# Patient Record
Sex: Female | Born: 1935 | Race: White | Hispanic: No | Smoking: Never smoker
Health system: Southern US, Community
[De-identification: ages and names within clinical notes are randomized; demographics above are authoritative.]

## PROBLEM LIST (undated history)

## (undated) DIAGNOSIS — S72001A Fracture of unspecified part of neck of right femur, initial encounter for closed fracture: Secondary | ICD-10-CM

## (undated) DIAGNOSIS — M545 Low back pain, unspecified: Secondary | ICD-10-CM

## (undated) DIAGNOSIS — E785 Hyperlipidemia, unspecified: Secondary | ICD-10-CM

## (undated) DIAGNOSIS — I2510 Atherosclerotic heart disease of native coronary artery without angina pectoris: Secondary | ICD-10-CM

## (undated) DIAGNOSIS — I10 Essential (primary) hypertension: Secondary | ICD-10-CM

## (undated) DIAGNOSIS — E039 Hypothyroidism, unspecified: Secondary | ICD-10-CM

## (undated) DIAGNOSIS — D649 Anemia, unspecified: Secondary | ICD-10-CM

## (undated) DIAGNOSIS — I6529 Occlusion and stenosis of unspecified carotid artery: Secondary | ICD-10-CM

## (undated) DIAGNOSIS — I4891 Unspecified atrial fibrillation: Secondary | ICD-10-CM

## (undated) DIAGNOSIS — I5032 Chronic diastolic (congestive) heart failure: Secondary | ICD-10-CM

## (undated) HISTORY — PX: ABDOMINAL HYSTERECTOMY: SHX81

## (undated) HISTORY — DX: Hypothyroidism, unspecified: E03.9

## (undated) HISTORY — PX: CORONARY ANGIOPLASTY WITH STENT PLACEMENT: SHX49

## (undated) HISTORY — DX: Low back pain: M54.5

## (undated) HISTORY — DX: Occlusion and stenosis of unspecified carotid artery: I65.29

## (undated) HISTORY — DX: Atherosclerotic heart disease of native coronary artery without angina pectoris: I25.10

## (undated) HISTORY — DX: Low back pain, unspecified: M54.50

## (undated) HISTORY — PX: HEMORRHOID SURGERY: SHX153

## (undated) HISTORY — DX: Chronic diastolic (congestive) heart failure: I50.32

## (undated) HISTORY — DX: Hyperlipidemia, unspecified: E78.5

## (undated) HISTORY — DX: Unspecified atrial fibrillation: I48.91

## (undated) HISTORY — DX: Fracture of unspecified part of neck of right femur, initial encounter for closed fracture: S72.001A

## (undated) HISTORY — DX: Anemia, unspecified: D64.9

## (undated) HISTORY — PX: CARDIAC SURGERY: SHX584

## (undated) HISTORY — DX: Essential (primary) hypertension: I10

## (undated) HISTORY — PX: TOTAL HIP ARTHROPLASTY: SHX124

---

## 1999-08-20 ENCOUNTER — Ambulatory Visit (HOSPITAL_COMMUNITY): Admission: RE | Admit: 1999-08-20 | Discharge: 1999-08-20 | Payer: Self-pay | Admitting: *Deleted

## 1999-11-13 ENCOUNTER — Other Ambulatory Visit: Admission: RE | Admit: 1999-11-13 | Discharge: 1999-11-13 | Payer: Self-pay | Admitting: *Deleted

## 1999-11-20 ENCOUNTER — Encounter: Admission: RE | Admit: 1999-11-20 | Discharge: 1999-11-20 | Payer: Self-pay | Admitting: *Deleted

## 1999-11-20 ENCOUNTER — Encounter: Payer: Self-pay | Admitting: *Deleted

## 2000-12-01 ENCOUNTER — Encounter: Admission: RE | Admit: 2000-12-01 | Discharge: 2000-12-01 | Payer: Self-pay | Admitting: Internal Medicine

## 2000-12-01 ENCOUNTER — Encounter: Payer: Self-pay | Admitting: Internal Medicine

## 2002-02-16 ENCOUNTER — Encounter: Admission: RE | Admit: 2002-02-16 | Discharge: 2002-02-16 | Payer: Self-pay | Admitting: Cardiology

## 2002-02-16 ENCOUNTER — Encounter: Payer: Self-pay | Admitting: Cardiology

## 2002-12-09 ENCOUNTER — Inpatient Hospital Stay (HOSPITAL_COMMUNITY): Admission: EM | Admit: 2002-12-09 | Discharge: 2002-12-15 | Payer: Self-pay | Admitting: Emergency Medicine

## 2002-12-09 ENCOUNTER — Encounter: Payer: Self-pay | Admitting: Emergency Medicine

## 2002-12-10 ENCOUNTER — Encounter: Payer: Self-pay | Admitting: Cardiology

## 2003-04-12 ENCOUNTER — Encounter: Admission: RE | Admit: 2003-04-12 | Discharge: 2003-04-12 | Payer: Self-pay | Admitting: Internal Medicine

## 2003-05-27 ENCOUNTER — Inpatient Hospital Stay (HOSPITAL_COMMUNITY): Admission: EM | Admit: 2003-05-27 | Discharge: 2003-05-30 | Payer: Self-pay | Admitting: Emergency Medicine

## 2003-05-30 ENCOUNTER — Inpatient Hospital Stay (HOSPITAL_COMMUNITY)
Admission: RE | Admit: 2003-05-30 | Discharge: 2003-06-06 | Payer: Self-pay | Admitting: Physical Medicine & Rehabilitation

## 2004-02-19 ENCOUNTER — Ambulatory Visit: Payer: Self-pay | Admitting: Internal Medicine

## 2004-05-08 ENCOUNTER — Encounter: Admission: RE | Admit: 2004-05-08 | Discharge: 2004-05-08 | Payer: Self-pay | Admitting: Cardiology

## 2004-06-14 ENCOUNTER — Encounter
Admission: RE | Admit: 2004-06-14 | Discharge: 2004-09-12 | Payer: Self-pay | Admitting: Physical Medicine and Rehabilitation

## 2004-07-18 ENCOUNTER — Ambulatory Visit: Payer: Self-pay | Admitting: Cardiology

## 2004-07-18 ENCOUNTER — Inpatient Hospital Stay (HOSPITAL_COMMUNITY): Admission: EM | Admit: 2004-07-18 | Discharge: 2004-07-19 | Payer: Self-pay | Admitting: Emergency Medicine

## 2004-07-19 ENCOUNTER — Encounter: Payer: Self-pay | Admitting: Cardiology

## 2004-07-22 ENCOUNTER — Ambulatory Visit: Payer: Self-pay

## 2004-07-24 ENCOUNTER — Ambulatory Visit: Payer: Self-pay | Admitting: Internal Medicine

## 2004-08-02 ENCOUNTER — Ambulatory Visit: Payer: Self-pay | Admitting: Cardiology

## 2004-08-09 ENCOUNTER — Ambulatory Visit: Payer: Self-pay | Admitting: Internal Medicine

## 2004-08-13 ENCOUNTER — Ambulatory Visit: Payer: Self-pay | Admitting: Cardiology

## 2004-08-16 ENCOUNTER — Ambulatory Visit: Payer: Self-pay | Admitting: Cardiovascular Disease

## 2004-08-30 ENCOUNTER — Ambulatory Visit: Payer: Self-pay | Admitting: Cardiovascular Disease

## 2004-09-20 ENCOUNTER — Ambulatory Visit: Payer: Self-pay | Admitting: Cardiology

## 2004-09-27 ENCOUNTER — Ambulatory Visit: Payer: Self-pay | Admitting: Cardiology

## 2004-10-18 ENCOUNTER — Ambulatory Visit: Payer: Self-pay | Admitting: Cardiology

## 2004-11-08 ENCOUNTER — Ambulatory Visit: Payer: Self-pay | Admitting: Cardiology

## 2004-11-22 ENCOUNTER — Ambulatory Visit: Payer: Self-pay | Admitting: Cardiology

## 2004-12-13 ENCOUNTER — Ambulatory Visit: Payer: Self-pay | Admitting: Cardiology

## 2005-01-10 ENCOUNTER — Ambulatory Visit: Payer: Self-pay | Admitting: Cardiology

## 2005-02-07 ENCOUNTER — Ambulatory Visit: Payer: Self-pay | Admitting: Cardiology

## 2005-02-17 ENCOUNTER — Ambulatory Visit: Payer: Self-pay | Admitting: Cardiology

## 2005-02-19 ENCOUNTER — Ambulatory Visit: Payer: Self-pay | Admitting: Internal Medicine

## 2005-03-03 ENCOUNTER — Ambulatory Visit: Payer: Self-pay | Admitting: Cardiology

## 2005-03-12 ENCOUNTER — Ambulatory Visit: Payer: Self-pay

## 2005-03-17 ENCOUNTER — Ambulatory Visit: Payer: Self-pay | Admitting: Cardiology

## 2005-03-24 ENCOUNTER — Ambulatory Visit: Payer: Self-pay | Admitting: Internal Medicine

## 2005-03-25 ENCOUNTER — Ambulatory Visit: Payer: Self-pay | Admitting: Cardiology

## 2005-04-18 ENCOUNTER — Ambulatory Visit: Payer: Self-pay | Admitting: Cardiology

## 2005-04-22 ENCOUNTER — Ambulatory Visit: Payer: Self-pay | Admitting: Cardiology

## 2005-05-16 ENCOUNTER — Encounter: Admission: RE | Admit: 2005-05-16 | Discharge: 2005-05-16 | Payer: Self-pay | Admitting: Internal Medicine

## 2005-05-21 ENCOUNTER — Ambulatory Visit: Payer: Self-pay | Admitting: Cardiology

## 2005-05-28 ENCOUNTER — Ambulatory Visit: Payer: Self-pay | Admitting: Internal Medicine

## 2005-06-16 ENCOUNTER — Ambulatory Visit: Payer: Self-pay | Admitting: Internal Medicine

## 2005-06-18 ENCOUNTER — Ambulatory Visit: Payer: Self-pay | Admitting: Internal Medicine

## 2005-06-19 ENCOUNTER — Ambulatory Visit: Payer: Self-pay | Admitting: Cardiology

## 2005-06-24 ENCOUNTER — Ambulatory Visit: Payer: Self-pay | Admitting: Internal Medicine

## 2005-07-16 ENCOUNTER — Ambulatory Visit: Payer: Self-pay | Admitting: Cardiology

## 2005-08-13 ENCOUNTER — Ambulatory Visit: Payer: Self-pay | Admitting: Cardiology

## 2005-08-20 ENCOUNTER — Ambulatory Visit: Payer: Self-pay | Admitting: Internal Medicine

## 2005-08-27 ENCOUNTER — Ambulatory Visit: Payer: Self-pay | Admitting: Internal Medicine

## 2005-08-27 ENCOUNTER — Ambulatory Visit: Payer: Self-pay | Admitting: Cardiology

## 2005-09-05 ENCOUNTER — Ambulatory Visit: Payer: Self-pay | Admitting: Cardiology

## 2005-09-15 ENCOUNTER — Ambulatory Visit: Payer: Self-pay | Admitting: Cardiology

## 2005-09-28 ENCOUNTER — Emergency Department (HOSPITAL_COMMUNITY): Admission: EM | Admit: 2005-09-28 | Discharge: 2005-09-28 | Payer: Self-pay | Admitting: Emergency Medicine

## 2005-09-29 ENCOUNTER — Ambulatory Visit: Payer: Self-pay | Admitting: Cardiology

## 2005-09-30 ENCOUNTER — Ambulatory Visit: Payer: Self-pay | Admitting: Internal Medicine

## 2005-10-01 ENCOUNTER — Ambulatory Visit: Payer: Self-pay | Admitting: Cardiovascular Disease

## 2005-10-06 ENCOUNTER — Ambulatory Visit: Payer: Self-pay | Admitting: Cardiology

## 2005-10-16 ENCOUNTER — Ambulatory Visit: Payer: Self-pay | Admitting: Cardiology

## 2005-10-21 ENCOUNTER — Ambulatory Visit: Payer: Self-pay | Admitting: Cardiology

## 2005-10-27 ENCOUNTER — Ambulatory Visit: Payer: Self-pay | Admitting: Cardiology

## 2005-11-17 ENCOUNTER — Ambulatory Visit: Payer: Self-pay | Admitting: Internal Medicine

## 2005-11-18 ENCOUNTER — Ambulatory Visit: Payer: Self-pay | Admitting: Internal Medicine

## 2005-12-01 ENCOUNTER — Ambulatory Visit: Payer: Self-pay | Admitting: Internal Medicine

## 2005-12-16 ENCOUNTER — Ambulatory Visit: Payer: Self-pay | Admitting: Internal Medicine

## 2005-12-17 ENCOUNTER — Ambulatory Visit: Payer: Self-pay | Admitting: *Deleted

## 2006-01-06 ENCOUNTER — Ambulatory Visit: Payer: Self-pay | Admitting: Cardiovascular Disease

## 2006-01-22 ENCOUNTER — Ambulatory Visit: Payer: Self-pay | Admitting: Cardiology

## 2006-02-03 ENCOUNTER — Ambulatory Visit: Payer: Self-pay | Admitting: Internal Medicine

## 2006-03-03 ENCOUNTER — Ambulatory Visit: Payer: Self-pay | Admitting: Cardiovascular Disease

## 2006-03-04 ENCOUNTER — Ambulatory Visit: Payer: Self-pay | Admitting: Internal Medicine

## 2006-03-11 ENCOUNTER — Ambulatory Visit: Payer: Self-pay | Admitting: Cardiovascular Disease

## 2006-04-10 ENCOUNTER — Ambulatory Visit: Payer: Self-pay | Admitting: Cardiology

## 2006-04-22 ENCOUNTER — Ambulatory Visit: Payer: Self-pay | Admitting: Cardiology

## 2006-05-13 ENCOUNTER — Ambulatory Visit: Payer: Self-pay | Admitting: Internal Medicine

## 2006-05-18 ENCOUNTER — Encounter: Admission: RE | Admit: 2006-05-18 | Discharge: 2006-05-18 | Payer: Self-pay | Admitting: Internal Medicine

## 2006-05-25 ENCOUNTER — Ambulatory Visit: Payer: Self-pay | Admitting: Internal Medicine

## 2006-05-25 LAB — CONVERTED CEMR LAB
ALT: 21 units/L (ref 0–40)
AST: 28 units/L (ref 0–37)
BUN: 16 mg/dL (ref 6–23)
Creatinine, Ser: 1 mg/dL (ref 0.4–1.2)
Glucose, Bld: 124 mg/dL — ABNORMAL HIGH (ref 70–99)
Triglycerides: 189 mg/dL — ABNORMAL HIGH (ref 0–149)

## 2006-06-08 ENCOUNTER — Ambulatory Visit: Payer: Self-pay | Admitting: Internal Medicine

## 2006-06-10 ENCOUNTER — Ambulatory Visit: Payer: Self-pay | Admitting: Internal Medicine

## 2006-06-22 ENCOUNTER — Ambulatory Visit: Payer: Self-pay | Admitting: Cardiology

## 2006-07-20 ENCOUNTER — Ambulatory Visit: Payer: Self-pay | Admitting: Cardiology

## 2006-07-27 ENCOUNTER — Ambulatory Visit: Payer: Self-pay | Admitting: Cardiology

## 2006-07-28 ENCOUNTER — Ambulatory Visit: Payer: Self-pay | Admitting: Internal Medicine

## 2006-07-28 LAB — CONVERTED CEMR LAB: TSH: 5.03 microintl units/mL (ref 0.35–5.50)

## 2006-07-30 ENCOUNTER — Encounter: Payer: Self-pay | Admitting: Internal Medicine

## 2006-07-30 LAB — CONVERTED CEMR LAB: Vit D, 1,25-Dihydroxy: 41 (ref 20–57)

## 2006-08-11 ENCOUNTER — Ambulatory Visit: Payer: Self-pay | Admitting: Internal Medicine

## 2006-08-17 ENCOUNTER — Ambulatory Visit: Payer: Self-pay | Admitting: Cardiovascular Disease

## 2006-08-31 ENCOUNTER — Ambulatory Visit: Payer: Self-pay | Admitting: Internal Medicine

## 2006-09-21 ENCOUNTER — Ambulatory Visit: Payer: Self-pay | Admitting: Internal Medicine

## 2006-10-05 ENCOUNTER — Ambulatory Visit: Payer: Self-pay | Admitting: Internal Medicine

## 2006-10-06 ENCOUNTER — Inpatient Hospital Stay (HOSPITAL_COMMUNITY): Admission: EM | Admit: 2006-10-06 | Discharge: 2006-10-09 | Payer: Self-pay | Admitting: Emergency Medicine

## 2006-10-06 ENCOUNTER — Ambulatory Visit: Admission: AD | Admit: 2006-10-06 | Discharge: 2006-10-06 | Payer: Self-pay | Admitting: Internal Medicine

## 2006-10-13 ENCOUNTER — Ambulatory Visit: Payer: Self-pay | Admitting: Internal Medicine

## 2006-10-13 LAB — CONVERTED CEMR LAB
BUN: 12 mg/dL (ref 6–23)
Basophils Absolute: 0.1 10*3/uL (ref 0.0–0.1)
Basophils Relative: 1.2 % — ABNORMAL HIGH (ref 0.0–1.0)
CO2: 30 meq/L (ref 19–32)
Calcium: 8.5 mg/dL (ref 8.4–10.5)
Creatinine, Ser: 0.8 mg/dL (ref 0.4–1.2)
GFR calc Af Amer: 88 mL/min
INR: 1.1 (ref 0.9–2.0)
Monocytes Relative: 8.8 % (ref 3.0–11.0)
Neutro Abs: 7.1 10*3/uL (ref 1.4–7.7)
Platelets: 363 10*3/uL (ref 150–400)
Pro B Natriuretic peptide (BNP): 599 pg/mL — ABNORMAL HIGH (ref 0.0–100.0)
Prothrombin Time: 12.9 s (ref 10.0–14.0)
RDW: 14.6 % (ref 11.5–14.6)
TSH: 4.25 microintl units/mL (ref 0.35–5.50)

## 2006-10-19 ENCOUNTER — Ambulatory Visit: Payer: Self-pay | Admitting: Cardiovascular Disease

## 2006-10-20 ENCOUNTER — Ambulatory Visit: Payer: Self-pay | Admitting: Internal Medicine

## 2006-10-21 ENCOUNTER — Ambulatory Visit: Payer: Self-pay | Admitting: Internal Medicine

## 2006-10-26 ENCOUNTER — Ambulatory Visit: Payer: Self-pay | Admitting: Internal Medicine

## 2006-10-26 ENCOUNTER — Ambulatory Visit: Payer: Self-pay | Admitting: Cardiovascular Disease

## 2006-11-04 ENCOUNTER — Encounter: Admission: RE | Admit: 2006-11-04 | Discharge: 2006-11-04 | Payer: Self-pay | Admitting: Internal Medicine

## 2006-11-09 ENCOUNTER — Ambulatory Visit: Payer: Self-pay | Admitting: Internal Medicine

## 2006-11-26 ENCOUNTER — Ambulatory Visit: Payer: Self-pay | Admitting: Cardiology

## 2006-11-30 ENCOUNTER — Ambulatory Visit: Payer: Self-pay | Admitting: Cardiology

## 2006-12-07 ENCOUNTER — Ambulatory Visit: Payer: Self-pay | Admitting: Internal Medicine

## 2006-12-28 ENCOUNTER — Ambulatory Visit: Payer: Self-pay | Admitting: Internal Medicine

## 2007-01-12 ENCOUNTER — Encounter: Admission: RE | Admit: 2007-01-12 | Discharge: 2007-01-12 | Payer: Self-pay | Admitting: Sports Medicine

## 2007-01-23 ENCOUNTER — Encounter: Payer: Self-pay | Admitting: Internal Medicine

## 2007-01-23 DIAGNOSIS — I2510 Atherosclerotic heart disease of native coronary artery without angina pectoris: Secondary | ICD-10-CM | POA: Insufficient documentation

## 2007-01-23 DIAGNOSIS — E119 Type 2 diabetes mellitus without complications: Secondary | ICD-10-CM

## 2007-01-23 DIAGNOSIS — I10 Essential (primary) hypertension: Secondary | ICD-10-CM | POA: Insufficient documentation

## 2007-01-23 DIAGNOSIS — D649 Anemia, unspecified: Secondary | ICD-10-CM | POA: Insufficient documentation

## 2007-01-23 DIAGNOSIS — I5032 Chronic diastolic (congestive) heart failure: Secondary | ICD-10-CM

## 2007-01-23 DIAGNOSIS — M810 Age-related osteoporosis without current pathological fracture: Secondary | ICD-10-CM | POA: Insufficient documentation

## 2007-01-23 DIAGNOSIS — E785 Hyperlipidemia, unspecified: Secondary | ICD-10-CM

## 2007-01-25 ENCOUNTER — Ambulatory Visit: Payer: Self-pay | Admitting: Internal Medicine

## 2007-02-19 ENCOUNTER — Ambulatory Visit: Payer: Self-pay | Admitting: Cardiology

## 2007-02-19 LAB — CONVERTED CEMR LAB
AST: 32 units/L (ref 0–37)
Alkaline Phosphatase: 53 units/L (ref 39–117)
Cholesterol: 158 mg/dL (ref 0–200)
Total Bilirubin: 1.2 mg/dL (ref 0.3–1.2)
Total CHOL/HDL Ratio: 3.1
Total Protein: 7.1 g/dL (ref 6.0–8.3)

## 2007-02-23 ENCOUNTER — Ambulatory Visit: Payer: Self-pay | Admitting: Internal Medicine

## 2007-02-23 ENCOUNTER — Ambulatory Visit: Payer: Self-pay | Admitting: Cardiology

## 2007-02-23 ENCOUNTER — Ambulatory Visit: Payer: Self-pay | Admitting: Cardiovascular Disease

## 2007-02-23 DIAGNOSIS — I4891 Unspecified atrial fibrillation: Secondary | ICD-10-CM | POA: Insufficient documentation

## 2007-02-23 DIAGNOSIS — M545 Low back pain: Secondary | ICD-10-CM

## 2007-02-24 ENCOUNTER — Encounter: Payer: Self-pay | Admitting: Internal Medicine

## 2007-03-04 ENCOUNTER — Ambulatory Visit: Payer: Self-pay | Admitting: Cardiology

## 2007-03-10 ENCOUNTER — Ambulatory Visit: Payer: Self-pay | Admitting: Cardiology

## 2007-03-19 ENCOUNTER — Ambulatory Visit: Payer: Self-pay | Admitting: Cardiology

## 2007-04-12 ENCOUNTER — Ambulatory Visit: Payer: Self-pay | Admitting: Cardiology

## 2007-05-07 ENCOUNTER — Ambulatory Visit: Payer: Self-pay | Admitting: Internal Medicine

## 2007-05-07 LAB — CONVERTED CEMR LAB
BUN: 17 mg/dL (ref 6–23)
Basophils Absolute: 0.1 10*3/uL (ref 0.0–0.1)
Calcium: 9.2 mg/dL (ref 8.4–10.5)
Chloride: 106 meq/L (ref 96–112)
Creatinine, Ser: 1.1 mg/dL (ref 0.4–1.2)
Eosinophils Absolute: 0.2 10*3/uL (ref 0.0–0.6)
GFR calc non Af Amer: 50 mL/min
HCT: 41 % (ref 36.0–46.0)
Hgb A1c MFr Bld: 6.6 % — ABNORMAL HIGH (ref 4.6–6.0)
MCHC: 35 g/dL (ref 30.0–36.0)
MCV: 98.6 fL (ref 78.0–100.0)
Monocytes Relative: 12.5 % — ABNORMAL HIGH (ref 3.0–11.0)
Neutrophils Relative %: 59.2 % (ref 43.0–77.0)
Platelets: 232 10*3/uL (ref 150–400)
RBC: 4.16 M/uL (ref 3.87–5.11)
RDW: 12.8 % (ref 11.5–14.6)
Vit D, 1,25-Dihydroxy: 37 (ref 30–89)

## 2007-05-10 ENCOUNTER — Ambulatory Visit: Payer: Self-pay | Admitting: Cardiology

## 2007-05-11 ENCOUNTER — Ambulatory Visit: Payer: Self-pay | Admitting: Cardiology

## 2007-05-18 ENCOUNTER — Ambulatory Visit: Payer: Self-pay | Admitting: Cardiology

## 2007-06-07 ENCOUNTER — Ambulatory Visit: Payer: Self-pay | Admitting: Cardiology

## 2007-07-05 ENCOUNTER — Ambulatory Visit: Payer: Self-pay | Admitting: Cardiology

## 2007-07-13 ENCOUNTER — Ambulatory Visit: Payer: Self-pay | Admitting: Internal Medicine

## 2007-07-13 DIAGNOSIS — E039 Hypothyroidism, unspecified: Secondary | ICD-10-CM | POA: Insufficient documentation

## 2007-07-26 ENCOUNTER — Ambulatory Visit: Payer: Self-pay | Admitting: Cardiology

## 2007-08-16 ENCOUNTER — Ambulatory Visit: Payer: Self-pay | Admitting: Internal Medicine

## 2007-08-24 ENCOUNTER — Encounter: Admission: RE | Admit: 2007-08-24 | Discharge: 2007-08-24 | Payer: Self-pay | Admitting: Internal Medicine

## 2007-09-07 ENCOUNTER — Ambulatory Visit: Payer: Self-pay | Admitting: Cardiology

## 2007-09-21 ENCOUNTER — Ambulatory Visit: Payer: Self-pay | Admitting: Internal Medicine

## 2007-10-08 ENCOUNTER — Encounter: Payer: Self-pay | Admitting: Internal Medicine

## 2007-10-12 ENCOUNTER — Ambulatory Visit: Payer: Self-pay | Admitting: Cardiology

## 2007-10-13 ENCOUNTER — Encounter: Payer: Self-pay | Admitting: Internal Medicine

## 2007-10-14 ENCOUNTER — Ambulatory Visit: Payer: Self-pay | Admitting: Internal Medicine

## 2007-10-19 ENCOUNTER — Ambulatory Visit: Payer: Self-pay | Admitting: Internal Medicine

## 2007-10-19 LAB — CONVERTED CEMR LAB
AST: 31 units/L (ref 0–37)
Albumin: 3.7 g/dL (ref 3.5–5.2)
BUN: 24 mg/dL — ABNORMAL HIGH (ref 6–23)
Basophils Absolute: 0 10*3/uL (ref 0.0–0.1)
Basophils Relative: 0.1 % (ref 0.0–1.0)
Chloride: 104 meq/L (ref 96–112)
Cholesterol: 167 mg/dL (ref 0–200)
Creatinine, Ser: 1.1 mg/dL (ref 0.4–1.2)
Eosinophils Absolute: 0 10*3/uL (ref 0.0–0.7)
Eosinophils Relative: 0.1 % (ref 0.0–5.0)
GFR calc Af Amer: 61 mL/min
GFR calc non Af Amer: 50 mL/min
HCT: 41.4 % (ref 36.0–46.0)
HDL: 54.8 mg/dL (ref >39.0)
Hgb A1c MFr Bld: 6.9 % — ABNORMAL HIGH (ref 4.6–6.0)
MCHC: 34.3 g/dL (ref 30.0–36.0)
MCV: 98 fL (ref 78.0–100.0)
Monocytes Absolute: 0.4 10*3/uL (ref 0.1–1.0)
Neutrophils Relative %: 85.4 % — ABNORMAL HIGH (ref 43.0–77.0)
Platelets: 228 10*3/uL (ref 150–400)
RBC: 4.23 M/uL (ref 3.87–5.11)
TSH: 1.7 microintl units/mL (ref 0.35–5.50)
Total Bilirubin: 1.2 mg/dL (ref 0.3–1.2)
VLDL: 18 mg/dL (ref 0–40)
WBC: 10.2 10*3/uL (ref 4.5–10.5)

## 2007-10-20 ENCOUNTER — Ambulatory Visit: Payer: Self-pay | Admitting: Cardiovascular Disease

## 2007-10-29 ENCOUNTER — Ambulatory Visit: Payer: Self-pay | Admitting: Cardiology

## 2007-11-08 ENCOUNTER — Ambulatory Visit: Payer: Self-pay | Admitting: Cardiovascular Disease

## 2007-11-11 ENCOUNTER — Ambulatory Visit: Payer: Self-pay | Admitting: Cardiology

## 2007-11-30 ENCOUNTER — Ambulatory Visit: Payer: Self-pay

## 2007-11-30 ENCOUNTER — Ambulatory Visit: Payer: Self-pay | Admitting: Cardiology

## 2007-12-14 ENCOUNTER — Ambulatory Visit: Payer: Self-pay | Admitting: Cardiology

## 2007-12-27 ENCOUNTER — Ambulatory Visit: Payer: Self-pay | Admitting: Cardiology

## 2008-01-17 ENCOUNTER — Ambulatory Visit: Payer: Self-pay | Admitting: Internal Medicine

## 2008-02-14 ENCOUNTER — Ambulatory Visit: Payer: Self-pay | Admitting: Cardiovascular Disease

## 2008-03-21 ENCOUNTER — Ambulatory Visit: Payer: Self-pay | Admitting: Cardiology

## 2008-04-18 ENCOUNTER — Ambulatory Visit: Payer: Self-pay | Admitting: Internal Medicine

## 2008-04-20 LAB — CONVERTED CEMR LAB
Chloride: 105 meq/L (ref 96–112)
GFR calc Af Amer: 55 mL/min
GFR calc non Af Amer: 45 mL/min
Hgb A1c MFr Bld: 6.5 % — ABNORMAL HIGH (ref 4.6–6.0)
Potassium: 4.2 meq/L (ref 3.5–5.1)
Sodium: 141 meq/L (ref 135–145)
TSH: 3.62 microintl units/mL (ref 0.35–5.50)

## 2008-04-24 ENCOUNTER — Ambulatory Visit: Payer: Self-pay | Admitting: Cardiology

## 2008-04-25 ENCOUNTER — Ambulatory Visit: Payer: Self-pay | Admitting: Internal Medicine

## 2008-04-25 DIAGNOSIS — M25559 Pain in unspecified hip: Secondary | ICD-10-CM

## 2008-05-04 ENCOUNTER — Encounter: Payer: Self-pay | Admitting: Internal Medicine

## 2008-05-08 ENCOUNTER — Ambulatory Visit: Payer: Self-pay | Admitting: Cardiology

## 2008-06-05 ENCOUNTER — Ambulatory Visit: Payer: Self-pay | Admitting: Cardiology

## 2008-07-03 ENCOUNTER — Ambulatory Visit: Payer: Self-pay | Admitting: Cardiology

## 2008-07-31 ENCOUNTER — Ambulatory Visit: Payer: Self-pay | Admitting: Cardiovascular Disease

## 2008-08-15 ENCOUNTER — Ambulatory Visit: Payer: Self-pay | Admitting: Internal Medicine

## 2008-08-15 LAB — CONVERTED CEMR LAB
Basophils Absolute: 0 10*3/uL (ref 0.0–0.1)
Calcium: 9.3 mg/dL (ref 8.4–10.5)
Eosinophils Relative: 2.1 % (ref 0.0–5.0)
GFR calc non Af Amer: 50.05 mL/min (ref >60)
Glucose, Bld: 101 mg/dL — ABNORMAL HIGH (ref 70–99)
HCT: 40.7 % (ref 36.0–46.0)
Hemoglobin: 14 g/dL (ref 12.0–15.0)
Hgb A1c MFr Bld: 6.4 % (ref 4.6–6.5)
Lymphocytes Relative: 24.5 % (ref 12.0–46.0)
Monocytes Relative: 8.9 % (ref 3.0–12.0)
Neutro Abs: 3.9 10*3/uL (ref 1.4–7.7)
Potassium: 4.6 meq/L (ref 3.5–5.1)
RBC: 4.11 M/uL (ref 3.87–5.11)
RDW: 12.9 % (ref 11.5–14.6)
Sodium: 142 meq/L (ref 135–145)
WBC: 5.9 10*3/uL (ref 4.5–10.5)

## 2008-08-23 ENCOUNTER — Ambulatory Visit: Payer: Self-pay | Admitting: Internal Medicine

## 2008-08-24 ENCOUNTER — Encounter: Admission: RE | Admit: 2008-08-24 | Discharge: 2008-08-24 | Payer: Self-pay | Admitting: Internal Medicine

## 2008-08-29 ENCOUNTER — Ambulatory Visit: Payer: Self-pay | Admitting: Internal Medicine

## 2008-09-12 ENCOUNTER — Encounter: Payer: Self-pay | Admitting: *Deleted

## 2008-09-26 ENCOUNTER — Encounter (INDEPENDENT_AMBULATORY_CARE_PROVIDER_SITE_OTHER): Payer: Self-pay | Admitting: Cardiology

## 2008-09-26 ENCOUNTER — Ambulatory Visit: Payer: Self-pay | Admitting: Internal Medicine

## 2008-09-26 LAB — CONVERTED CEMR LAB: POC INR: 2.8

## 2008-10-18 ENCOUNTER — Encounter: Payer: Self-pay | Admitting: *Deleted

## 2008-10-24 ENCOUNTER — Ambulatory Visit: Payer: Self-pay | Admitting: Cardiology

## 2008-10-24 ENCOUNTER — Encounter (INDEPENDENT_AMBULATORY_CARE_PROVIDER_SITE_OTHER): Payer: Self-pay | Admitting: Cardiology

## 2008-10-24 LAB — CONVERTED CEMR LAB
POC INR: 5.7
Prothrombin Time: 29 s

## 2008-11-07 ENCOUNTER — Ambulatory Visit: Payer: Self-pay | Admitting: Internal Medicine

## 2008-11-07 ENCOUNTER — Encounter (INDEPENDENT_AMBULATORY_CARE_PROVIDER_SITE_OTHER): Payer: Self-pay | Admitting: Cardiology

## 2008-11-07 LAB — CONVERTED CEMR LAB: POC INR: 1.7

## 2008-11-21 ENCOUNTER — Ambulatory Visit: Payer: Self-pay | Admitting: Internal Medicine

## 2008-12-12 ENCOUNTER — Ambulatory Visit: Payer: Self-pay | Admitting: Cardiology

## 2008-12-15 ENCOUNTER — Encounter: Admission: RE | Admit: 2008-12-15 | Discharge: 2008-12-15 | Payer: Self-pay | Admitting: Family Medicine

## 2008-12-26 ENCOUNTER — Ambulatory Visit: Payer: Self-pay | Admitting: Internal Medicine

## 2009-01-01 LAB — CONVERTED CEMR LAB
AST: 38 units/L — ABNORMAL HIGH (ref 0–37)
Alkaline Phosphatase: 54 units/L (ref 39–117)
BUN: 23 mg/dL (ref 6–23)
Bilirubin Urine: NEGATIVE
Bilirubin, Direct: 0.2 mg/dL (ref 0.0–0.3)
CO2: 29 meq/L (ref 19–32)
Chloride: 107 meq/L (ref 96–112)
Creatinine, Ser: 1 mg/dL (ref 0.4–1.2)
Direct LDL: 90.7 mg/dL
Hgb A1c MFr Bld: 6.4 % (ref 4.6–6.5)
Nitrite: POSITIVE
TSH: 2.93 microintl units/mL (ref 0.35–5.50)
Total CHOL/HDL Ratio: 3
Uric Acid, Serum: 5.7 mg/dL (ref 2.4–7.0)
Urobilinogen, UA: 0.2 (ref 0.0–1.0)
Vitamin B-12: >1500 pg/mL — ABNORMAL HIGH (ref 211–911)

## 2009-01-09 ENCOUNTER — Ambulatory Visit: Payer: Self-pay | Admitting: Cardiology

## 2009-01-09 LAB — CONVERTED CEMR LAB: POC INR: 2.5

## 2009-02-06 ENCOUNTER — Ambulatory Visit: Payer: Self-pay | Admitting: Cardiovascular Disease

## 2009-02-27 ENCOUNTER — Ambulatory Visit: Payer: Self-pay | Admitting: Cardiology

## 2009-03-12 ENCOUNTER — Ambulatory Visit: Payer: Self-pay | Admitting: Cardiology

## 2009-03-14 ENCOUNTER — Encounter: Payer: Self-pay | Admitting: Internal Medicine

## 2009-03-26 ENCOUNTER — Ambulatory Visit: Payer: Self-pay | Admitting: Cardiovascular Disease

## 2009-03-26 LAB — CONVERTED CEMR LAB: POC INR: 2.6

## 2009-03-27 ENCOUNTER — Ambulatory Visit: Payer: Self-pay | Admitting: Internal Medicine

## 2009-03-27 DIAGNOSIS — Z87891 Personal history of nicotine dependence: Secondary | ICD-10-CM | POA: Insufficient documentation

## 2009-03-27 DIAGNOSIS — M6530 Trigger finger, unspecified finger: Secondary | ICD-10-CM | POA: Insufficient documentation

## 2009-04-23 ENCOUNTER — Encounter (INDEPENDENT_AMBULATORY_CARE_PROVIDER_SITE_OTHER): Payer: Self-pay | Admitting: Cardiology

## 2009-04-23 ENCOUNTER — Ambulatory Visit: Payer: Self-pay | Admitting: Internal Medicine

## 2009-04-23 LAB — CONVERTED CEMR LAB: POC INR: 2.7

## 2009-05-21 ENCOUNTER — Ambulatory Visit: Payer: Self-pay | Admitting: Cardiovascular Disease

## 2009-06-18 ENCOUNTER — Ambulatory Visit: Payer: Self-pay | Admitting: Internal Medicine

## 2009-06-18 LAB — CONVERTED CEMR LAB: POC INR: 3

## 2009-07-16 ENCOUNTER — Ambulatory Visit: Payer: Self-pay | Admitting: Cardiovascular Disease

## 2009-07-16 LAB — CONVERTED CEMR LAB: POC INR: 2.9

## 2009-08-09 ENCOUNTER — Ambulatory Visit: Payer: Self-pay | Admitting: Cardiology

## 2009-08-15 ENCOUNTER — Telehealth: Payer: Self-pay | Admitting: Internal Medicine

## 2009-08-27 ENCOUNTER — Encounter: Admission: RE | Admit: 2009-08-27 | Discharge: 2009-08-27 | Payer: Self-pay | Admitting: Internal Medicine

## 2009-09-03 ENCOUNTER — Ambulatory Visit: Payer: Self-pay | Admitting: Cardiology

## 2009-09-18 ENCOUNTER — Ambulatory Visit: Payer: Self-pay | Admitting: Internal Medicine

## 2009-09-18 LAB — CONVERTED CEMR LAB
Alkaline Phosphatase: 52 units/L (ref 39–117)
BUN: 19 mg/dL (ref 6–23)
Basophils Absolute: 0 10*3/uL (ref 0.0–0.1)
Bilirubin, Direct: 0.2 mg/dL (ref 0.0–0.3)
Eosinophils Absolute: 0.1 10*3/uL (ref 0.0–0.7)
GFR calc non Af Amer: 56.38 mL/min (ref >60)
Glucose, Bld: 111 mg/dL — ABNORMAL HIGH (ref 70–99)
HCT: 44 % (ref 36.0–46.0)
Hemoglobin: 15.3 g/dL — ABNORMAL HIGH (ref 12.0–15.0)
Lymphs Abs: 1.5 10*3/uL (ref 0.7–4.0)
MCHC: 34.7 g/dL (ref 30.0–36.0)
MCV: 99.6 fL (ref 78.0–100.0)
Monocytes Absolute: 0.5 10*3/uL (ref 0.1–1.0)
Neutro Abs: 4 10*3/uL (ref 1.4–7.7)
Potassium: 5.1 meq/L (ref 3.5–5.1)
RDW: 14.1 % (ref 11.5–14.6)
Total Protein: 7.1 g/dL (ref 6.0–8.3)
VLDL: 44.4 mg/dL — ABNORMAL HIGH (ref 0.0–40.0)

## 2009-09-24 ENCOUNTER — Ambulatory Visit: Payer: Self-pay | Admitting: Cardiology

## 2009-09-25 ENCOUNTER — Ambulatory Visit: Payer: Self-pay | Admitting: Internal Medicine

## 2009-09-25 DIAGNOSIS — N3090 Cystitis, unspecified without hematuria: Secondary | ICD-10-CM | POA: Insufficient documentation

## 2009-09-25 DIAGNOSIS — R609 Edema, unspecified: Secondary | ICD-10-CM | POA: Insufficient documentation

## 2009-09-26 LAB — CONVERTED CEMR LAB
Ketones, ur: NEGATIVE mg/dL
Specific Gravity, Urine: >=1.03 (ref 1.000–1.030)
Urine Glucose: NEGATIVE mg/dL
pH: 6 (ref 5.0–8.0)

## 2009-10-05 ENCOUNTER — Ambulatory Visit: Payer: Self-pay | Admitting: Internal Medicine

## 2009-10-10 ENCOUNTER — Ambulatory Visit: Payer: Self-pay | Admitting: Internal Medicine

## 2009-10-10 LAB — CONVERTED CEMR LAB
Bilirubin Urine: NEGATIVE
Hemoglobin, Urine: NEGATIVE
Nitrite: NEGATIVE
Urine Glucose: NEGATIVE mg/dL
Urobilinogen, UA: 0.2 (ref 0.0–1.0)

## 2009-10-16 LAB — CONVERTED CEMR LAB
Ketones, ur: NEGATIVE mg/dL
Leukocytes, UA: NEGATIVE
Specific Gravity, Urine: 1.015 (ref 1.000–1.030)
pH: 5 (ref 5.0–8.0)

## 2009-10-22 ENCOUNTER — Ambulatory Visit: Payer: Self-pay | Admitting: Internal Medicine

## 2009-10-22 LAB — CONVERTED CEMR LAB: POC INR: 2.9

## 2009-11-06 ENCOUNTER — Ambulatory Visit: Payer: Self-pay | Admitting: Internal Medicine

## 2009-11-19 ENCOUNTER — Ambulatory Visit: Payer: Self-pay | Admitting: Cardiovascular Disease

## 2009-11-19 LAB — CONVERTED CEMR LAB: POC INR: 3.1

## 2009-11-20 ENCOUNTER — Encounter: Payer: Self-pay | Admitting: Cardiology

## 2009-11-20 DIAGNOSIS — I6529 Occlusion and stenosis of unspecified carotid artery: Secondary | ICD-10-CM

## 2009-11-21 ENCOUNTER — Ambulatory Visit: Payer: Self-pay

## 2009-11-21 ENCOUNTER — Encounter: Payer: Self-pay | Admitting: Cardiovascular Disease

## 2009-12-18 ENCOUNTER — Ambulatory Visit: Payer: Self-pay | Admitting: Cardiovascular Disease

## 2009-12-18 LAB — CONVERTED CEMR LAB: POC INR: 3.1

## 2010-01-04 ENCOUNTER — Ambulatory Visit: Payer: Self-pay | Admitting: Cardiology

## 2010-01-30 ENCOUNTER — Encounter: Payer: Self-pay | Admitting: Internal Medicine

## 2010-01-30 ENCOUNTER — Ambulatory Visit: Payer: Self-pay | Admitting: Internal Medicine

## 2010-02-13 ENCOUNTER — Ambulatory Visit: Payer: Self-pay | Admitting: Cardiology

## 2010-02-13 LAB — CONVERTED CEMR LAB: POC INR: 3.5

## 2010-02-26 ENCOUNTER — Ambulatory Visit: Payer: Self-pay | Admitting: Cardiovascular Disease

## 2010-02-26 LAB — CONVERTED CEMR LAB: POC INR: 2.5

## 2010-03-05 ENCOUNTER — Ambulatory Visit: Payer: Self-pay | Admitting: Internal Medicine

## 2010-03-05 LAB — CONVERTED CEMR LAB
Albumin: 4 g/dL (ref 3.5–5.2)
Basophils Relative: 0.6 % (ref 0.0–3.0)
Bilirubin, Direct: 0.2 mg/dL (ref 0.0–0.3)
CO2: 29 meq/L (ref 19–32)
Chloride: 104 meq/L (ref 96–112)
Cholesterol: 180 mg/dL (ref 0–200)
Creatinine, Ser: 1.2 mg/dL (ref 0.4–1.2)
Eosinophils Absolute: 0.1 10*3/uL (ref 0.0–0.7)
Glucose, Bld: 123 mg/dL — ABNORMAL HIGH (ref 70–99)
MCHC: 35.1 g/dL (ref 30.0–36.0)
MCV: 99.5 fL (ref 78.0–100.0)
Monocytes Absolute: 0.6 10*3/uL (ref 0.1–1.0)
Neutro Abs: 4.9 10*3/uL (ref 1.4–7.7)
Neutrophils Relative %: 68.3 % (ref 43.0–77.0)
Nitrite: POSITIVE
RBC: 4.38 M/uL (ref 3.87–5.11)
RDW: 13.7 % (ref 11.5–14.6)
Total CHOL/HDL Ratio: 4
Total Protein: 6.7 g/dL (ref 6.0–8.3)
Triglycerides: 199 mg/dL — ABNORMAL HIGH (ref 0.0–149.0)
Urobilinogen, UA: 0.2 (ref 0.0–1.0)

## 2010-03-06 ENCOUNTER — Telehealth: Payer: Self-pay | Admitting: Internal Medicine

## 2010-03-06 ENCOUNTER — Ambulatory Visit: Payer: Self-pay | Admitting: Internal Medicine

## 2010-03-06 DIAGNOSIS — J019 Acute sinusitis, unspecified: Secondary | ICD-10-CM

## 2010-03-06 DIAGNOSIS — H9209 Otalgia, unspecified ear: Secondary | ICD-10-CM | POA: Insufficient documentation

## 2010-03-19 ENCOUNTER — Ambulatory Visit: Payer: Self-pay | Admitting: Cardiovascular Disease

## 2010-03-19 ENCOUNTER — Ambulatory Visit: Payer: Self-pay | Admitting: Internal Medicine

## 2010-03-19 DIAGNOSIS — H9190 Unspecified hearing loss, unspecified ear: Secondary | ICD-10-CM | POA: Insufficient documentation

## 2010-03-20 ENCOUNTER — Telehealth: Payer: Self-pay | Admitting: Internal Medicine

## 2010-03-28 ENCOUNTER — Encounter: Payer: Self-pay | Admitting: Internal Medicine

## 2010-04-16 ENCOUNTER — Ambulatory Visit: Admission: RE | Admit: 2010-04-16 | Discharge: 2010-04-16 | Payer: Self-pay | Source: Home / Self Care

## 2010-05-05 ENCOUNTER — Encounter: Payer: Self-pay | Admitting: Internal Medicine

## 2010-05-14 ENCOUNTER — Ambulatory Visit: Admission: RE | Admit: 2010-05-14 | Discharge: 2010-05-14 | Payer: Self-pay | Source: Home / Self Care

## 2010-05-14 LAB — CONVERTED CEMR LAB: POC INR: 2.4

## 2010-05-14 NOTE — Medication Information (Signed)
Summary: rov/mlw  Anticoagulant Therapy  Managed by: Weston Brass, PharmD Referring MD: Rollene Rotunda MD PCP: Georgina Quint Plotnikov MD Supervising MD: Daleen Squibb MD, Maisie Fus Indication 1: Atrial Fibrillation (ICD-427.31) Lab Used: [facility] Site: [facility] INR POC 3.3 INR RANGE 2 - 3  Dietary changes: no    Health status changes: no    Bleeding/hemorrhagic complications: yes       Details: some issues with hemrroids recently   Recent/future hospitalizations: no    Any changes in medication regimen? no    Recent/future dental: no  Any missed doses?: no       Is patient compliant with meds? yes       Allergies: No Known Drug Allergies  Anticoagulation Management History:      The patient is taking warfarin and comes in today for a routine follow up visit.  Positive risk factors for bleeding include an age of 75 years or older and presence of serious comorbidities.  The bleeding index is 'intermediate risk'.  Positive CHADS2 values include History of CHF, History of HTN, Age > 75 years old, and History of Diabetes.  The start date was 07/19/2004.  Her last INR was 1.1 RATIO.  Anticoagulation responsible provider: Daleen Squibb MD, Maisie Fus.  INR POC: 3.3.  Cuvette Lot#: [id number].  Exp: 09/2010.    Anticoagulation Management Assessment/Plan:      The patient's current anticoagulation dose is Coumadin 5 mg tabs: as dirrected.  The target INR is 2 - 3.  The next INR is due 09/03/2009.  Anticoagulation instructions were given to patient.  Results were reviewed/authorized by Weston Brass, PharmD.  She was notified by Weston Brass PharmD.         Prior Anticoagulation Instructions: INR 2.9  The patient is to continue with the same dose of coumadin.  This dosage includes: one tab daily (5 mg) except 1.5 tab (7.5 mg) on Tuesdays and Thursdays.   Next appointment Monday, May 2nd at 1:30 pm.   Current Anticoagulation Instructions: INR 3.3  Take 1/2 tablet today then resume same dose of 1 tablet every  day except 1 1/2 tablets on Tuesday and Thursday

## 2010-05-14 NOTE — Medication Information (Signed)
Summary: rov/tm  Anticoagulant Therapy  Managed by: Bethena Midget, RN, BSN Referring MD: Rollene Rotunda MD PCP: Tresa Garter MD Supervising MD: Eden Emms MD, Theron Arista Indication 1: Atrial Fibrillation (ICD-427.31) Lab Used: [facility] Site: [facility] INR POC 2.2 INR RANGE 2 - 3  Dietary changes: no    Health status changes: no    Bleeding/hemorrhagic complications: no    Recent/future hospitalizations: no    Any changes in medication regimen? no    Recent/future dental: no  Any missed doses?: no       Is patient compliant with meds? yes       Allergies: No Known Drug Allergies  Anticoagulation Management History:      The patient is taking warfarin and comes in today for a routine follow up visit.  Positive risk factors for bleeding include an age of 75 years or older and presence of serious comorbidities.  The bleeding index is 'intermediate risk'.  Positive CHADS2 values include History of CHF, History of HTN, Age > 57 years old, and History of Diabetes.  The start date was 07/19/2004.  Her last INR was 1.1 RATIO.  Anticoagulation responsible provider: Eden Emms MD, Theron Arista.  INR POC: 2.2.  Cuvette Lot#: [id number].  Exp: 01/2011.    Anticoagulation Management Assessment/Plan:      The patient's current anticoagulation dose is Coumadin 5 mg tabs: as dirrected.  The target INR is 2 - 3.  The next INR is due 04/16/2010.  Anticoagulation instructions were given to patient.  Results were reviewed/authorized by Bethena Midget, RN, BSN.  She was notified by Bethena Midget, RN, BSN.         Prior Anticoagulation Instructions: INR 2.5 Continue 5mg s daily. Recheck in 3 weeks.   Current Anticoagulation Instructions: INR 2.2 Continue 5mg s everyday. Recheck in 4 weeks.

## 2010-05-14 NOTE — Medication Information (Signed)
Summary: rov/eac  Anticoagulant Therapy  Managed by: Cloyde Reams, RN, BSN Referring MD: Rollene Rotunda MD PCP: Tresa Garter MD Supervising MD: Ladona Ridgel MD, Sharlot Gowda Indication 1: Atrial Fibrillation (ICD-427.31) Lab Used: [facility] Site: [facility] INR POC 3.0 INR RANGE 2 - 3  Dietary changes: no    Health status changes: no    Bleeding/hemorrhagic complications: no    Recent/future hospitalizations: no    Any changes in medication regimen? no    Recent/future dental: no  Any missed doses?: no       Is patient compliant with meds? yes       Allergies (verified): No Known Drug Allergies  Anticoagulation Management History:      The patient is taking warfarin and comes in today for a routine follow up visit.  Positive risk factors for bleeding include an age of 75 years or older and presence of serious comorbidities.  The bleeding index is 'intermediate risk'.  Positive CHADS2 values include History of CHF, History of HTN, Age > 60 years old, and History of Diabetes.  The start date was 07/19/2004.  Her last INR was 1.1 RATIO.  Anticoagulation responsible provider: Ladona Ridgel MD, Sharlot Gowda.  INR POC: 3.0.  Cuvette Lot#: [id number].  Exp: 08/2010.    Anticoagulation Management Assessment/Plan:      The patient's current anticoagulation dose is Coumadin 5 mg tabs: as dirrected.  The target INR is 2 - 3.  The next INR is due 07/16/2009.  Anticoagulation instructions were given to patient.  Results were reviewed/authorized by Cloyde Reams, RN, BSN.  She was notified by Cloyde Reams RN.         Prior Anticoagulation Instructions: INR 2.6  Continue current dosing schedule.  Take 1/2 tablet on Tuesday and thursday, and take 1 tablet all other days. Return to 4 weeks.  Current Anticoagulation Instructions: INR 3.0  Take 1/2 tablet today then resume same dosage 1 tablet daily except 1.5 tablets on Tuesdays and Thursdays.  Recheck in 4 weeks.

## 2010-05-14 NOTE — Medication Information (Signed)
Summary: rov/jaj   Anticoagulant Therapy  Managed by: Weston Brass, PharmD Referring MD: Rollene Rotunda MD PCP: Georgina Quint Plotnikov MD Supervising MD: Graciela Husbands MD, Viviann Spare Indication 1: Atrial Fibrillation (ICD-427.31) Lab Used: [facility] Site: [facility] INR POC 3.6 INR RANGE 2 - 3  Dietary changes: no    Health status changes: no    Bleeding/hemorrhagic complications: no    Recent/future hospitalizations: no    Any changes in medication regimen? no    Recent/future dental: no  Any missed doses?: no       Is patient compliant with meds? yes       Allergies: No Known Drug Allergies  Anticoagulation Management History:      The patient is taking warfarin and comes in today for a routine follow up visit.  Positive risk factors for bleeding include an age of 75 years or older and presence of serious comorbidities.  The bleeding index is 'intermediate risk'.  Positive CHADS2 values include History of CHF, History of HTN, Age > 65 years old, and History of Diabetes.  The start date was 07/19/2004.  Her last INR was 1.1 RATIO.  Anticoagulation responsible provider: Graciela Husbands MD, Viviann Spare.  INR POC: 3.6.  Cuvette Lot#: [id number].  Exp: 02/2011.    Anticoagulation Management Assessment/Plan:      The patient's current anticoagulation dose is Coumadin 5 mg tabs: as dirrected.  The target INR is 2 - 3.  The next INR is due 02/13/2010.  Anticoagulation instructions were given to patient.  Results were reviewed/authorized by Weston Brass, PharmD.  She was notified by Haynes Hoehn, PharmD Candidate.         Prior Anticoagulation Instructions: INR 2.9  Continue taking 1 tablet everyday except take 1 1/2 tablets on Thursdays. Re-check INR in 4 weeks.   Current Anticoagulation Instructions: INR 3.6  Skip today's dose, then resume Coumadin 1 tablet every day of the week, except 1 and 1/2 tablets on Tuesdays.  Return to clinic in 2 weeks.

## 2010-05-14 NOTE — Medication Information (Signed)
Summary: rov/sp  Anticoagulant Therapy  Managed by: Weston Brass, PharmD Referring MD: Rollene Rotunda MD PCP: Tresa Garter MD Supervising MD: Myrtis Ser MD, Tinnie Gens Indication 1: Atrial Fibrillation (ICD-427.31) Lab Used: [facility]  Site: [facility] INR POC 2.9 INR RANGE 2 - 3  Dietary changes: no    Health status changes: no    Bleeding/hemorrhagic complications: no    Recent/future hospitalizations: no    Any changes in medication regimen? no    Recent/future dental: no  Any missed doses?: no       Is patient compliant with meds? yes       Allergies: No Known Drug Allergies  Anticoagulation Management History:      The patient is taking warfarin and comes in today for a routine follow up visit.  Positive risk factors for bleeding include an age of 19 years or older and presence of serious comorbidities.  The bleeding index is 'intermediate risk'.  Positive CHADS2 values include History of CHF, History of HTN, Age > 20 years old, and History of Diabetes.  The start date was 07/19/2004.  Her last INR was 1.1 RATIO.  Anticoagulation responsible provider: Myrtis Ser MD, Tinnie Gens.  INR POC: 2.9.  Cuvette Lot#: [id number].  Exp: 02/2011.    Anticoagulation Management Assessment/Plan:      The patient's current anticoagulation dose is Coumadin 5 mg tabs: as dirrected.  The target INR is 2 - 3.  The next INR is due 01/30/2010.  Anticoagulation instructions were given to patient.  Results were reviewed/authorized by Weston Brass, PharmD.  She was notified by Harrel Carina, PharmD candidate.         Prior Anticoagulation Instructions: INR 3.1 Continue current regimen except on tuesday go down to 1 tablet instead of a 1 1/2 tablet. Take 1 tablet on sunday, monday, tuesday, wednesday,1 1/2 on thursday, 1 tablet on friday, and 1 tablet on saturday.  Current Anticoagulation Instructions: INR 2.9  Continue taking 1 tablet everyday except take 1 1/2 tablets on Thursdays. Re-check INR in 4  weeks.

## 2010-05-14 NOTE — Progress Notes (Signed)
Summary: FYI Zostavax  Phone Note Outgoing Call   Call placed by: Lanier Prude, [facility]),  March 20, 2010 1:12 PM Summary of Call: called to inform pt per Vicie Mutters (office manager) pt would owe full price for Zostavax per [facility]. Pt states she will think about this and let us know at a later time if she wants vaccine.  Follow-up for Phone Call        Thank you!  Follow-up by: Tresa Garter MD,  March 20, 2010 5:32 PM

## 2010-05-14 NOTE — Assessment & Plan Note (Signed)
Summary: 6 MO ROV /NWS #   Vital Signs:  Patient profile:   77 year old female Height:      53 inches Weight:      156 pounds BMI:     39.19 O2 Sat:      98 % on Room air Temp:     96.8 degrees F oral Pulse rate:   66 / minute BP sitting:   112 / 62  (left arm) Cuff size:   regular  Vitals Entered By: Lucious Groves (September 25, 2009 1:34 PM)  O2 Flow:  Room air CC: 6 mo rtn ov./kb Is Patient Diabetic? Yes Pain Assessment Patient in pain? no        CC:  6 mo rtn ov./kb.  Current Medications (verified): 1)  Coumadin 5 Mg Tabs (Warfarin Sodium) .... As Dirrected 2)  Crestor 20 Mg  Tabs (Rosuvastatin Calcium) .... Once Daily 3)  Lopressor 50 Mg  Tabs (Metoprolol Tartrate) .Marland Kitchen.. 11/2 Two Times A Day 4)  Levothyroxine Sodium 25 Mcg  Tabs (Levothyroxine Sodium) .... Once Daily 5)  Klor-Con M20 20 Meq Cr-Tabs (Potassium Chloride Crys Cr) .... Take 1 Tablet By Mouth Once A Day 6)  Vitamin-B Complex   Tabs (B Complex Vitamins) .... Once Daily 7)  Fish Oil 1000 Mg  Caps (Omega-3 Fatty Acids) .... Once Daily 8)  Vitamin D3 1000 Unit  Tabs (Cholecalciferol) .Marland Kitchen.. 1 Qd 9)  Vitamin C 500 Mg  Tabs (Ascorbic Acid) .... Once Daily 10)  Voltaren 1 % Gel (Diclofenac Sodium) .... Use Two Times A Day On A Joint(S)  Allergies (verified): No Known Drug Allergies  Physical Exam  General:  Well-developed,well-nourished,in no acute distress; alert,appropriate and cooperative throughout examination Nose:  External nasal examination shows no deformity or inflammation. Nasal mucosa are pink and moist without lesions or exudates. Mouth:  Oral mucosa and oropharynx without lesions or exudates.  Teeth in good repair. Lungs:  Normal respiratory effort, chest expands symmetrically. Lungs are clear to auscultation, no crackles or wheezes. Heart:  Irregular rhythm. S1 and S2 normal without gallop, murmur, click, rub or other extra sounds. Abdomen:  Bowel sounds positive,abdomen soft and non-tender without  masses, organomegaly or hernias noted. Msk:  No deformity or scoliosis noted of thoracic or lumbar spine.  R troch major is less tender Lumbar-sacral spine is tender to palpation over paraspinal muscles and painfull with the ROM L ring finger trigger  Extremities:  No edema Neurologic:  No cranial nerve deficits noted. Station and gait are normal. Plantar reflexes are down-going bilaterally. DTRs are symmetrical throughout. Sensory, motor and coordinative functions appear intact. Skin:  Intact without suspicious lesions or rashes Psych:  Cognition and judgment appear intact. Alert and cooperative with normal attention span and concentration. No apparent delusions, illusions, hallucinations   Impression & Recommendations:  Problem # 1:  HYPOTHYROIDISM (ICD-244.9) Assessment Deteriorated  The following medications were removed from the medication list:    Levothyroxine Sodium 25 Mcg Tabs (Levothyroxine sodium) ..... Once daily Her updated medication list for this problem includes:    Levothroid 50 Mcg Tabs (Levothyroxine sodium) .Marland Kitchen... 1 by mouth once daily for thyroid  Labs Reviewed: TSH: 6.17 (09/18/2009)    HgBA1c: 6.4 (12/26/2008) Chol: 198 (09/18/2009)   HDL: 60.80 (09/18/2009)   LDL: 94 (10/14/2007)   TG: 222.0 (09/18/2009)  Problem # 2:  EDEMA (ICD-782.3) Assessment: Comment Only  Her updated medication list for this problem includes:    Furosemide 40 Mg Tabs (Furosemide) .Marland Kitchen... 1/2 or  1 tab by mouth q am ( a water pill) prn prn  Orders: Prescription Created Electronically [phone]) TLB-Udip ONLY ([id number])  Problem # 3:  ATRIAL FIBRILLATION (ICD-427.31)  Her updated medication list for this problem includes:    Coumadin 5 Mg Tabs (Warfarin sodium) .Marland Kitchen... As dirrected    Lopressor 50 Mg Tabs (Metoprolol tartrate) .Marland Kitchen... 11/2 two times a day  Problem # 4:  HYPERTENSION (ICD-401.9) Assessment: Improved  Her updated medication list for this problem includes:    Lopressor  50 Mg Tabs (Metoprolol tartrate) .Marland Kitchen... 11/2 two times a day    Furosemide 40 Mg Tabs (Furosemide) .Marland Kitchen... 1/2 or 1 tab by mouth q am ( a water pill) prn  BP today: 112/62 Prior BP: 142/70 (08/09/2009)  Labs Reviewed: K+: 5.1 (09/18/2009) Creat: : 1.0 (09/18/2009)   Chol: 198 (09/18/2009)   HDL: 60.80 (09/18/2009)   LDL: 94 (10/14/2007)   TG: 222.0 (09/18/2009)  Problem # 5:  CYSTITIS (ICD-595.9) Assessment: New  Her updated medication list for this problem includes:    Ciprofloxacin Hcl 250 Mg Tabs (Ciprofloxacin hcl) .Marland Kitchen... 1 by mouth two times a day for cystitis  Complete Medication List: 1)  Coumadin 5 Mg Tabs (Warfarin sodium) .... As dirrected 2)  Crestor 20 Mg Tabs (Rosuvastatin calcium) .... Once daily 3)  Lopressor 50 Mg Tabs (Metoprolol tartrate) .Marland Kitchen.. 11/2 two times a day 4)  Klor-con M20 20 Meq Cr-tabs (Potassium chloride crys cr) .... Take 1 tablet by mouth once a day 5)  Vitamin-b Complex Tabs (B complex vitamins) .... Once daily 6)  Fish Oil 1000 Mg Caps (Omega-3 fatty acids) .... Once daily 7)  Vitamin D3 1000 Unit Tabs (Cholecalciferol) .Marland Kitchen.. 1 qd 8)  Vitamin C 500 Mg Tabs (Ascorbic acid) .... Once daily 9)  Voltaren 1 % Gel (Diclofenac sodium) .... Use two times a day on a joint(s) 10)  Levothroid 50 Mcg Tabs (Levothyroxine sodium) .Marland Kitchen.. 1 by mouth once daily for thyroid 11)  Furosemide 40 Mg Tabs (Furosemide) .... 1/2 or 1 tab by mouth q am ( a water pill) prn 12)  Ciprofloxacin Hcl 250 Mg Tabs (Ciprofloxacin hcl) .Marland Kitchen.. 1 by mouth two times a day for cystitis  Patient Instructions: 1)  TSH prior to visit, ICD-9:244.8 in 6 wks 2)  Please schedule a follow-up appointment in 6 months well  w/labs. 3)  Use  balance exercises that I have provided Prescriptions: CIPROFLOXACIN HCL 250 MG TABS (CIPROFLOXACIN HCL) 1 by mouth two times a day for cystitis  #10 x 0   Entered and Authorized by:   Tresa Garter MD   Signed by:   Tresa Garter MD on 09/25/2009   Method  used:   Electronically to        [facility]  [phone]* (retail)       [address]       [place], [place]  [address]       Ph: [phone] or [phone]       Fax: [phone]   RxID:   [id number] FUROSEMIDE 40 MG TABS (FUROSEMIDE) 1/2 or 1 tab by mouth q am ( a water pill)  #30 x 1   Entered and Authorized by:   Tresa Garter MD   Signed by:   Tresa Garter MD on 09/25/2009   Method used:   Electronically to        [facility]  [phone]* (retail)  [address]       [place], [place]  [address]       Ph: [phone] or [phone]       Fax: [phone]   RxID:   [id number] LEVOTHROID 50 MCG TABS (LEVOTHYROXINE SODIUM) 1 by mouth once daily for thyroid  #30 x 12   Entered and Authorized by:   Tresa Garter MD   Signed by:   Tresa Garter MD on 09/25/2009   Method used:   Electronically to        [facility]  [phone]* (retail)       [address]       [place], [place]  [address]       Ph: [phone] or [phone]       Fax: [phone]   RxID:   [id number]

## 2010-05-14 NOTE — Medication Information (Signed)
Summary: rov/tm   Anticoagulant Therapy  Managed by: Bethena Midget, RN, BSN Referring MD: Rollene Rotunda MD PCP: Tresa Garter MD Supervising MD: Eden Emms MD,Peter Indication 1: Atrial Fibrillation (ICD-427.31) Lab Used: [facility] Site: [facility] INR POC 3.1 INR RANGE 2 - 3  Dietary changes: no    Health status changes: no    Bleeding/hemorrhagic complications: no    Recent/future hospitalizations: no    Any changes in medication regimen? no    Recent/future dental: no  Any missed doses?: no       Is patient compliant with meds? yes       Allergies: No Known Drug Allergies  Anticoagulation Management History:      The patient is taking warfarin and comes in today for a routine follow up visit.  Positive risk factors for bleeding include an age of 75 years or older and presence of serious comorbidities.  The bleeding index is 'intermediate risk'.  Positive CHADS2 values include History of CHF, History of HTN, Age > 71 years old, and History of Diabetes.  The start date was 07/19/2004.  Her last INR was 1.1 RATIO.  Anticoagulation responsible provider: Eden Emms MD,Peter.  INR POC: 3.1.  Cuvette Lot#: [id number].  Exp: 01/2011.    Anticoagulation Management Assessment/Plan:      The patient's current anticoagulation dose is Coumadin 5 mg tabs: as dirrected.  The target INR is 2 - 3.  The next INR is due 01/08/2010.  Anticoagulation instructions were given to patient.  Results were reviewed/authorized by Bethena Midget, RN, BSN.         Prior Anticoagulation Instructions: INR 3.1 Today only take 1/2 pill then continue 1 pill everyday except 1.5 pills on Tuesdays and Thursdays. Recheck in 4 weeks.   Current Anticoagulation Instructions: INR 3.1 Continue current regimen except on tuesday go down to 1 tablet instead of a 1 1/2 tablet. Take 1 tablet on sunday, monday, tuesday, wednesday,1 1/2 on thursday, 1 tablet on friday, and 1 tablet on saturday.

## 2010-05-14 NOTE — Medication Information (Signed)
Summary: rov/eac  Anticoagulant Therapy  Managed by: Bethena Midget, RN, BSN Referring MD: Rollene Rotunda MD PCP: Tresa Garter MD Supervising MD: Antoine Poche MD, Fayrene Fearing Indication 1: Atrial Fibrillation (ICD-427.31) Lab Used: [facility] Site: [facility] INR POC 2.9 INR RANGE 2 - 3  Dietary changes: no    Health status changes: no    Bleeding/hemorrhagic complications: no    Recent/future hospitalizations: no    Any changes in medication regimen? no    Recent/future dental: no  Any missed doses?: no       Is patient compliant with meds? yes       Allergies: No Known Drug Allergies  Anticoagulation Management History:      The patient is taking warfarin and comes in today for a routine follow up visit.  Positive risk factors for bleeding include an age of 75 years or older and presence of serious comorbidities.  The bleeding index is 'intermediate risk'.  Positive CHADS2 values include History of CHF, History of HTN, Age > 46 years old, and History of Diabetes.  The start date was 07/19/2004.  Her last INR was 1.1 RATIO.  Anticoagulation responsible provider: Antoine Poche MD, Fayrene Fearing.  INR POC: 2.9.  Cuvette Lot#: [id number].  Exp: 11/2010.    Anticoagulation Management Assessment/Plan:      The patient's current anticoagulation dose is Coumadin 5 mg tabs: as dirrected.  The target INR is 2 - 3.  The next INR is due 10/22/2009.  Anticoagulation instructions were given to patient.  Results were reviewed/authorized by Bethena Midget, RN, BSN.  She was notified by Bethena Midget, RN, BSN.         Prior Anticoagulation Instructions: INR 3.3  Do NOT take coumadin today.  Then return to normal dosing schedule of 1.5 tablets on Tuesday and Thursday and 1 tablet all other days.  Return to clinic in 3 weeks.      Current Anticoagulation Instructions: INR 2.9 Continue 5mg s everyday except 7.5mg s on Tuesdays and Thursdays. Recheck in 4 weeks.

## 2010-05-14 NOTE — Assessment & Plan Note (Signed)
Summary: 6 MONTH ROV 427.31 414.01 PFH,RN      Allergies Added: NKDA  Visit Type:  Follow-up Primary Provider:  Tresa Garter MD  CC:  Atrial fibrillation/CAD.  History of Present Illness: The patient presents for followup.  Since I last saw her she has done well. She has no new cardiovascular complaints. She does not notice palpitations and has had no presyncope or syncope. She participates in hysical therapy and has had no problems with this. She is limited by right hip pain. She denies any chest pressure, neck or arm discomfort. She has no shortness of breath, PND or orthopnea.  Current Medications (verified): 1)  Coumadin 5 Mg Tabs (Warfarin Sodium) .... As Dirrected 2)  Crestor 20 Mg  Tabs (Rosuvastatin Calcium) .... Once Daily 3)  Lopressor 50 Mg  Tabs (Metoprolol Tartrate) .Marland Kitchen.. 11/2 Two Times A Day 4)  Levothyroxine Sodium 25 Mcg  Tabs (Levothyroxine Sodium) .... Once Daily 5)  Klor-Con M20 20 Meq Cr-Tabs (Potassium Chloride Crys Cr) .... Take 1 Tablet By Mouth Once A Day 6)  Vitamin-B Complex   Tabs (B Complex Vitamins) .... Once Daily 7)  Fish Oil 1000 Mg  Caps (Omega-3 Fatty Acids) .... Once Daily 8)  Vitamin D3 1000 Unit  Tabs (Cholecalciferol) .Marland Kitchen.. 1 Qd 9)  Vitamin C 500 Mg  Tabs (Ascorbic Acid) .... Once Daily 10)  Voltaren 1 % Gel (Diclofenac Sodium) .... Use Two Times A Day On A Joint(S)  Allergies (verified): No Known Drug Allergies  Past History:  Past Medical History: Anemia-NOS Congestive heart failure  Dr Antoine Poche Coronary artery disease(ejection fraction 69%.      She had stenting of a vein graft with a Taxus stent in the past. She is      status post CABG with an SVG to the LAD and SVG to circumflex) Diabetes mellitus, type II Hyperlipidemia Hypertension Osteoporosis Rigth hip fx 2/5 Low back pain Dr Prince Rome Anticoagulation therapy Atrial fibrillation Hypothyroidism  Past Surgical History: Reviewed history from 07/13/2007 and no changes  required. Total hip replacement R  Review of Systems       As stated in the HPI and negative for all other systems.   Vital Signs:  Patient profile:   77 year old female Height:      53 inches Weight:      157 pounds BMI:     39.44 Pulse rate:   69 / minute Resp:     16 per minute BP sitting:   142 / 70  (right arm)  Vitals Entered By: Marrion Coy, CNA (August 09, 2009 1:58 PM)  Physical Exam  General:  Well-developed,well-nourished,in no acute distress; alert,appropriate and cooperative throughout examination Head:  normocephalic and atraumatic Eyes:  PERRLA/EOM intact; conjunctiva and lids normal. Mouth:  Oral mucosa and oropharynx without lesions or exudates.  Teeth in good repair. Neck:  Neck supple, no JVD. No masses, thyromegaly or abnormal cervical nodes. Chest Wall:  Well-healed sternotomy scar Lungs:  Normal respiratory effort, chest expands symmetrically. Lungs are clear to auscultation, no crackles or wheezes. Abdomen:  Bowel sounds positive,abdomen soft and non-tender without masses, organomegaly or hernias noted. Msk:  Back normal, normal gait. Muscle strength and tone normal. Extremities:  No clubbing or cyanosis. Neurologic:  Alert and oriented x 3. Skin:  Intact without lesions or rashes. Psych:  Normal affect.   Detailed Cardiovascular Exam  Neck    Carotids: Carotids full and equal bilaterally without bruits.      Neck Veins:  Normal, no JVD.    Heart    Inspection: no deformities or lifts noted.      Palpation: normal PMI with no thrills palpable.      Auscultation: irregular rhythm, S1 and S2 within normal limits, no S3, no clicks, no rubs, no murmurs.  Vascular    Abdominal Aorta: no palpable masses, pulsations, or audible bruits.      Femoral Pulses: normal femoral pulses bilaterally.      Pedal Pulses: normal pedal pulses bilaterally.      Radial Pulses: normal radial pulses bilaterally.      Peripheral Circulation: no clubbing, cyanosis, or  edema noted with normal capillary refill.     EKG  Procedure date:  08/09/2009  Findings:      atrial fibrillation, rate 69, axis within normal limits, intervals within normal limits, nonspecific T-wave flattening  Impression & Recommendations:  Problem # 1:  ATRIAL FIBRILLATION (ICD-427.31) The patient is tolerating the atrial fibrillation and Coumadin. I will give her information on Pradaxa and she will check to see whether her insurance carrier will cover this. She would be a reasonable candidate. Orders: EKG w/ Interpretation (93000)  Problem # 2:  HYPERTENSION (ICD-401.9) Her blood pressure is well controlled. She will continue meds as listed.  Problem # 3:  CONGESTIVE HEART FAILURE (ICD-428.0) She is having no new symptoms. No further cardiovascular testing is suggested.  Patient Instructions: 1)  Your physician recommends that you schedule a follow-up appointment in: 1YEAR 2)  Your physician recommends that you continue on your current medications as directed. Please refer to the Current Medication list given to you today.

## 2010-05-14 NOTE — Medication Information (Signed)
Summary: [name]  Anticoagulant Therapy  Managed by: Shelby Dubin, PharmD, BCPS, CPP Referring MD: Rollene Rotunda MD PCP: Georgina Quint Plotnikov MD Supervising MD: Tenny Craw MD, Gunnar Fusi Indication 1: Atrial Fibrillation (ICD-427.31) Lab Used: [facility] Site: [facility] INR POC 2.7 INR RANGE 2 - 3  Dietary changes: no    Health status changes: no    Bleeding/hemorrhagic complications: no    Recent/future hospitalizations: no    Any changes in medication regimen? no    Recent/future dental: no  Any missed doses?: no       Is patient compliant with meds? yes       Allergies (verified): No Known Drug Allergies  Anticoagulation Management History:      The patient is taking warfarin and comes in today for a routine follow up visit.  Positive risk factors for bleeding include an age of 75 years or older and presence of serious comorbidities.  The bleeding index is 'intermediate risk'.  Positive CHADS2 values include History of CHF, History of HTN, Age > 107 years old, and History of Diabetes.  The start date was 07/19/2004.  Her last INR was 1.1 RATIO.  Anticoagulation responsible provider: Tenny Craw MD, Gunnar Fusi.  INR POC: 2.7.  Cuvette Lot#: [id number].  Exp: 07/2010.    Anticoagulation Management Assessment/Plan:      The patient's current anticoagulation dose is Coumadin 5 mg tabs: as dirrected.  The target INR is 2 - 3.  The next INR is due 05/21/2009.  Anticoagulation instructions were given to patient.  Results were reviewed/authorized by Shelby Dubin, PharmD, BCPS, CPP.  She was notified by Shelby Dubin PharmD, BCPS, CPP.         Prior Anticoagulation Instructions: INR 2.6  Continue on same dosage 1 tablet daily except 1.5 tablets on Tuesdays and Thursdays.   Recheck in 4 weeks.    Current Anticoagulation Instructions: INR 2.7  Continue 1 tab daily except 1.5 tabs Tuesdays and Thursdays.  Recheck in 4 weeks.

## 2010-05-14 NOTE — Progress Notes (Signed)
Summary: cold  Phone Note Call from Patient Call back at Home Phone [phone]   Summary of Call: Patient requests a call from office due to having a cold. I spoke with patient and she c/o having a terrible cold--chest congestion, green mucous producing cough (has not tried mucinex yet) but notes that Robitussin is not helping. Please advise. Initial call taken by: Lucious Groves,  Aug 15, 2009 9:25 AM  Follow-up for Phone Call        [facility]pac Use over-the-counter medicines for "cold": Tylenol  650mg  or Advil 400mg  every 6 hours  for fever; Delsym or Robutussin for cough. Mucinex or Mucinex D for congestion. Ricola or Halls for sore throat. Office visit if not better or if worse.  Follow-up by: Tresa Garter MD,  Aug 15, 2009 12:45 PM  Additional Follow-up for Phone Call Additional follow up Details #1::        patient notified. Additional Follow-up by: Lucious Groves,  Aug 15, 2009 1:31 PM    New/Updated Medications: ZITHROMAX Z-PAK 250 MG TABS (AZITHROMYCIN) as dirrected Prescriptions: ZITHROMAX Z-PAK 250 MG TABS (AZITHROMYCIN) as dirrected  #1 x 0   Entered and Authorized by:   Tresa Garter MD   Signed by:   Lucious Groves on 08/15/2009   Method used:   Electronically to        [facility]  [phone]* (retail)       [address]       [place], [place]  [address]       Ph: [phone] or [phone]       Fax: [phone]   RxID:   [id number]

## 2010-05-14 NOTE — Medication Information (Signed)
Summary: rov/sp  Anticoagulant Therapy  Managed by: Eda Keys, PharmD Referring MD: Rollene Rotunda MD PCP: Georgina Quint Plotnikov MD Supervising MD: Jens Som MD, Arlys John Indication 1: Atrial Fibrillation (ICD-427.31) Lab Used: [facility] Site: [facility] INR POC 3.3 INR RANGE 2 - 3  Dietary changes: no    Health status changes: no    Bleeding/hemorrhagic complications: no    Recent/future hospitalizations: no    Any changes in medication regimen? no    Recent/future dental: no  Any missed doses?: no       Is patient compliant with meds? yes       Allergies: No Known Drug Allergies  Anticoagulation Management History:      The patient is taking warfarin and comes in today for a routine follow up visit.  Positive risk factors for bleeding include an age of 75 years or older and presence of serious comorbidities.  The bleeding index is 'intermediate risk'.  Positive CHADS2 values include History of CHF, History of HTN, Age > 75 years old, and History of Diabetes.  The start date was 07/19/2004.  Her last INR was 1.1 RATIO.  Anticoagulation responsible provider: Jens Som MD, Arlys John.  INR POC: 3.3.  Cuvette Lot#: [id number].  Exp: 11/2010.    Anticoagulation Management Assessment/Plan:      The patient's current anticoagulation dose is Coumadin 5 mg tabs: as dirrected.  The target INR is 2 - 3.  The next INR is due 09/24/2009.  Anticoagulation instructions were given to patient.  Results were reviewed/authorized by Eda Keys, PharmD.  She was notified by Eda Keys.         Prior Anticoagulation Instructions: INR 3.3  Take 1/2 tablet today then resume same dose of 1 tablet every day except 1 1/2 tablets on Tuesday and Thursday   Current Anticoagulation Instructions: INR 3.3  Do NOT take coumadin today.  Then return to normal dosing schedule of 1.5 tablets on Tuesday and Thursday and 1 tablet all other days.  Return to clinic in 3 weeks.

## 2010-05-14 NOTE — Medication Information (Signed)
Summary: rov.mp  Anticoagulant Therapy  Managed by: Eda Keys, PharmD Referring MD: Rollene Rotunda MD PCP: Tresa Garter MD Supervising MD: Excell Seltzer MD, Casimiro Needle Indication 1: Atrial Fibrillation (ICD-427.31) Lab Used: [facility] Site: [facility] INR POC 2.6 INR RANGE 2 - 3  Dietary changes: no    Health status changes: no    Bleeding/hemorrhagic complications: no    Recent/future hospitalizations: no    Any changes in medication regimen? no    Recent/future dental: no  Any missed doses?: no       Is patient compliant with meds? yes       Current Medications (verified): 1)  Coumadin 5 Mg Tabs (Warfarin Sodium) .... As Dirrected 2)  Crestor 20 Mg  Tabs (Rosuvastatin Calcium) .... Once Daily 3)  Lopressor 50 Mg  Tabs (Metoprolol Tartrate) .Marland Kitchen.. 11/2 Two Times A Day 4)  Levothyroxine Sodium 25 Mcg  Tabs (Levothyroxine Sodium) .... Once Daily 5)  Klor-Con M20 20 Meq Cr-Tabs (Potassium Chloride Crys Cr) .... Take 1 Tablet By Mouth Once A Day 6)  Vitamin-B Complex   Tabs (B Complex Vitamins) .... Once Daily 7)  Fish Oil 1000 Mg  Caps (Omega-3 Fatty Acids) .... Once Daily 8)  Vitamin D3 1000 Unit  Tabs (Cholecalciferol) .Marland Kitchen.. 1 Qd 9)  Vitamin C 500 Mg  Tabs (Ascorbic Acid) .... Once Daily 10)  Voltaren 1 % Gel (Diclofenac Sodium) .... Use Two Times A Day On A Joint(S)  Allergies (verified): No Known Drug Allergies  Anticoagulation Management History:      The patient is taking warfarin and comes in today for a routine follow up visit.  Positive risk factors for bleeding include an age of 75 years or older and presence of serious comorbidities.  The bleeding index is 'intermediate risk'.  Positive CHADS2 values include History of CHF, History of HTN, Age > 49 years old, and History of Diabetes.  The start date was 07/19/2004.  Her last INR was 1.1 RATIO.  Anticoagulation responsible provider: Excell Seltzer MD, Casimiro Needle.  INR POC: 2.6.  Cuvette Lot#: [id number].  Exp: 07/2010.     Anticoagulation Management Assessment/Plan:      The patient's current anticoagulation dose is Coumadin 5 mg tabs: as dirrected.  The target INR is 2 - 3.  The next INR is due 06/18/2009.  Anticoagulation instructions were given to patient.  Results were reviewed/authorized by Eda Keys, PharmD.  She was notified by Eda Keys.         Prior Anticoagulation Instructions: INR 2.7  Continue 1 tab daily except 1.5 tabs Tuesdays and Thursdays.  Recheck in 4 weeks.    Current Anticoagulation Instructions: INR 2.6  Continue current dosing schedule.  Take 1/2 tablet on Tuesday and thursday, and take 1 tablet all other days. Return to 4 weeks.

## 2010-05-14 NOTE — Medication Information (Signed)
Summary: rov/ewj      Allergies Added: NKDA Anticoagulant Therapy  Managed by: Lynann Bologna, PharmD Referring MD: Rollene Rotunda MD PCP: Georgina Quint Plotnikov MD Supervising MD: Clifton James MD, Cristal Deer Indication 1: Atrial Fibrillation (ICD-427.31) Lab Used: [facility] Site: [facility] INR POC 2.9 INR RANGE 2 - 3  Dietary changes: no    Health status changes: no    Bleeding/hemorrhagic complications: no    Recent/future hospitalizations: no    Any changes in medication regimen? no    Recent/future dental: no  Any missed doses?: no       Is patient compliant with meds? yes       Current Medications (verified): 1)  Coumadin 5 Mg Tabs (Warfarin Sodium) .... As Dirrected 2)  Crestor 20 Mg  Tabs (Rosuvastatin Calcium) .... Once Daily 3)  Lopressor 50 Mg  Tabs (Metoprolol Tartrate) .Marland Kitchen.. 11/2 Two Times A Day 4)  Levothyroxine Sodium 25 Mcg  Tabs (Levothyroxine Sodium) .... Once Daily 5)  Klor-Con M20 20 Meq Cr-Tabs (Potassium Chloride Crys Cr) .... Take 1 Tablet By Mouth Once A Day 6)  Vitamin-B Complex   Tabs (B Complex Vitamins) .... Once Daily 7)  Fish Oil 1000 Mg  Caps (Omega-3 Fatty Acids) .... Once Daily 8)  Vitamin D3 1000 Unit  Tabs (Cholecalciferol) .Marland Kitchen.. 1 Qd 9)  Vitamin C 500 Mg  Tabs (Ascorbic Acid) .... Once Daily 10)  Voltaren 1 % Gel (Diclofenac Sodium) .... Use Two Times A Day On A Joint(S)  Allergies (verified): No Known Drug Allergies  Anticoagulation Management History:      Positive risk factors for bleeding include an age of 75 years or older and presence of serious comorbidities.  The bleeding index is 'intermediate risk'.  Positive CHADS2 values include History of CHF, History of HTN, Age > 71 years old, and History of Diabetes.  The start date was 07/19/2004.  Her last INR was 1.1 RATIO.  Anticoagulation responsible provider: Clifton James MD, Cristal Deer.  INR POC: 2.9.  Exp: 08/2010.    Anticoagulation Management Assessment/Plan:      The patient's  current anticoagulation dose is Coumadin 5 mg tabs: as dirrected.  The target INR is 2 - 3.  The next INR is due 08/13/2009.  Anticoagulation instructions were given to patient.  Results were reviewed/authorized by Lynann Bologna, PharmD.  She was notified by Lynann Bologna.         Prior Anticoagulation Instructions: INR 3.0  Take 1/2 tablet today then resume same dosage 1 tablet daily except 1.5 tablets on Tuesdays and Thursdays.  Recheck in 4 weeks.    Current Anticoagulation Instructions: INR 2.9  The patient is to continue with the same dose of coumadin.  This dosage includes: one tab daily (5 mg) except 1.5 tab (7.5 mg) on Tuesdays and Thursdays.   Next appointment Monday, May 2nd at 1:30 pm.

## 2010-05-14 NOTE — Progress Notes (Signed)
Summary: OV TODAY w/DR Jonny Ruiz  Phone Note Call from Patient   Summary of Call: Pt used qtip yesterday and says she "moved" something and c/o ear pain. Scheduled for office visit today.  Initial call taken by: Lamar Sprinkles, CMA,  March 06, 2010 2:07 PM  Follow-up for Phone Call        noted Follow-up by: Corwin Levins MD,  March 06, 2010 2:14 PM

## 2010-05-14 NOTE — Medication Information (Signed)
Summary: rov/tm   Anticoagulant Therapy  Managed by: Weston Brass, PharmD Referring MD: Rollene Rotunda MD PCP: Georgina Quint Plotnikov MD Supervising MD: Riley Kill MD, Maisie Fus Indication 1: Atrial Fibrillation (ICD-427.31) Lab Used: [facility] Site: [facility] INR POC 3.5 INR RANGE 2 - 3  Dietary changes: no    Health status changes: no    Bleeding/hemorrhagic complications: no    Recent/future hospitalizations: no    Any changes in medication regimen? no    Recent/future dental: no  Any missed doses?: no       Is patient compliant with meds? yes       Allergies: No Known Drug Allergies  Anticoagulation Management History:      The patient is taking warfarin and comes in today for a routine follow up visit.  Positive risk factors for bleeding include an age of 75 years or older and presence of serious comorbidities.  The bleeding index is 'intermediate risk'.  Positive CHADS2 values include History of CHF, History of HTN, Age > 50 years old, and History of Diabetes.  The start date was 07/19/2004.  Her last INR was 1.1 RATIO.  Anticoagulation responsible provider: Riley Kill MD, Maisie Fus.  INR POC: 3.5.  Cuvette Lot#: [id number].  Exp: 02/2011.    Anticoagulation Management Assessment/Plan:      The patient's current anticoagulation dose is Coumadin 5 mg tabs: as dirrected.  The target INR is 2 - 3.  The next INR is due 02/26/2010.  Anticoagulation instructions were given to patient.  Results were reviewed/authorized by Weston Brass, PharmD.  She was notified by Hoy Register, PharmD Candidate.         Prior Anticoagulation Instructions: INR 3.6  Skip today's dose, then resume Coumadin 1 tablet every day of the week, except 1 and 1/2 tablets on Tuesdays.  Return to clinic in 2 weeks.    Current Anticoagulation Instructions: INR 3.5  Hold todays dose and then start taking 1 tablet by mouth everyday.  Recheck INR in 2 weeks

## 2010-05-14 NOTE — Medication Information (Signed)
Summary: rov/tm  Anticoagulant Therapy  Managed by: Cloyde Reams, RN, BSN Referring MD: Rollene Rotunda MD PCP: Georgina Quint Plotnikov MD Supervising MD: Tenny Craw MD, Gunnar Fusi Indication 1: Atrial Fibrillation (ICD-427.31) Lab Used: [facility] Site: [facility] INR POC 2.9 INR RANGE 2 - 3  Dietary changes: no    Health status changes: no    Bleeding/hemorrhagic complications: no    Recent/future hospitalizations: no    Any changes in medication regimen? no    Recent/future dental: no  Any missed doses?: no       Is patient compliant with meds? yes       Allergies: No Known Drug Allergies  Anticoagulation Management History:      The patient is taking warfarin and comes in today for a routine follow up visit.  Positive risk factors for bleeding include an age of 75 years or older and presence of serious comorbidities.  The bleeding index is 'intermediate risk'.  Positive CHADS2 values include History of CHF, History of HTN, Age > 75 years old, and History of Diabetes.  The start date was 07/19/2004.  Her last INR was 1.1 RATIO.  Anticoagulation responsible provider: Tenny Craw MD, Gunnar Fusi.  INR POC: 2.9.  Cuvette Lot#: [id number].  Exp: 12/2010.    Anticoagulation Management Assessment/Plan:      The patient's current anticoagulation dose is Coumadin 5 mg tabs: as dirrected.  The target INR is 2 - 3.  The next INR is due 11/19/2009.  Anticoagulation instructions were given to patient.  Results were reviewed/authorized by Cloyde Reams, RN, BSN.  She was notified by Cloyde Reams RN.         Prior Anticoagulation Instructions: INR 2.9 Continue 5mg s everyday except 7.5mg s on Tuesdays and Thursdays. Recheck in 4 weeks.   Current Anticoagulation Instructions: INR 2.9  Continue on same dosage 5mg  daily except 7.5mg  on Tuesdays and Thursdays.  Recheck in 4 weeks.

## 2010-05-14 NOTE — Miscellaneous (Signed)
Summary: Orders Update  Clinical Lists Changes  Problems: Added new problem of CAROTID ARTERY DISEASE (ICD-433.10) Orders: Added new Test order of Carotid Duplex (Carotid Duplex) - Signed 

## 2010-05-14 NOTE — Consult Note (Signed)
Summary: [facility] [place]   Imported By: Lanelle Bal 10/25/2007 14:30:08  _____________________________________________________________________  External Attachment:    Type:   Image     Comment:   External Document

## 2010-05-14 NOTE — Medication Information (Signed)
Summary: rov/nb  Anticoagulant Therapy  Managed by: Bethena Midget, RN, BSN Referring MD: Rollene Rotunda MD PCP: Tresa Garter MD Supervising MD: Excell Seltzer MD, Casimiro Needle Indication 1: Atrial Fibrillation (ICD-427.31) Lab Used: [facility] Site: [facility] INR POC 2.5 INR RANGE 2 - 3  Dietary changes: no    Health status changes: no    Bleeding/hemorrhagic complications: no    Recent/future hospitalizations: no    Any changes in medication regimen? no    Recent/future dental: no  Any missed doses?: no       Is patient compliant with meds? yes       Allergies: No Known Drug Allergies  Anticoagulation Management History:      The patient is taking warfarin and comes in today for a routine follow up visit.  Positive risk factors for bleeding include an age of 75 years or older and presence of serious comorbidities.  The bleeding index is 'intermediate risk'.  Positive CHADS2 values include History of CHF, History of HTN, Age > 75 years old, and History of Diabetes.  The start date was 07/19/2004.  Her last INR was 1.1 RATIO.  Anticoagulation responsible Audreyanna Butkiewicz: Excell Seltzer MD, Casimiro Needle.  INR POC: 2.5.  Cuvette Lot#: [id number].  Exp: 03/2011.    Anticoagulation Management Assessment/Plan:      The patient's current anticoagulation dose is Coumadin 5 mg tabs: as dirrected.  The target INR is 2 - 3.  The next INR is due 03/19/2010.  Anticoagulation instructions were given to patient.  Results were reviewed/authorized by Bethena Midget, RN, BSN.  She was notified by Bethena Midget, RN, BSN.         Prior Anticoagulation Instructions: INR 3.5  Hold todays dose and then start taking 1 tablet by mouth everyday.  Recheck INR in 2 weeks  Current Anticoagulation Instructions: INR 2.5 Continue 5mg s daily. Recheck in 3 weeks.

## 2010-05-14 NOTE — Medication Information (Signed)
Summary: [name]  Anticoagulant Therapy  Managed by: Bethena Midget, RN, BSN Referring MD: Rollene Rotunda MD PCP: Tresa Garter MD Supervising MD: Clifton James MD, Cristal Deer Indication 1: Atrial Fibrillation (ICD-427.31) Lab Used: [facility] Site: [facility] INR POC 3.1 INR RANGE 2 - 3  Dietary changes: no    Health status changes: no    Bleeding/hemorrhagic complications: no    Recent/future hospitalizations: no    Any changes in medication regimen? no    Recent/future dental: no  Any missed doses?: no       Is patient compliant with meds? yes       Allergies: No Known Drug Allergies  Anticoagulation Management History:      The patient is taking warfarin and comes in today for a routine follow up visit.  Positive risk factors for bleeding include an age of 75 years or older and presence of serious comorbidities.  The bleeding index is 'intermediate risk'.  Positive CHADS2 values include History of CHF, History of HTN, Age > 8 years old, and History of Diabetes.  The start date was 07/19/2004.  Her last INR was 1.1 RATIO.  Anticoagulation responsible provider: Clifton James MD, Cristal Deer.  INR POC: 3.1.  Cuvette Lot#: [id number].  Exp: 01/2011.    Anticoagulation Management Assessment/Plan:      The patient's current anticoagulation dose is Coumadin 5 mg tabs: as dirrected.  The target INR is 2 - 3.  The next INR is due 12/18/2009.  Anticoagulation instructions were given to patient.  Results were reviewed/authorized by Bethena Midget, RN, BSN.  She was notified by Bethena Midget, RN, BSN.         Prior Anticoagulation Instructions: INR 2.9  Continue on same dosage 5mg  daily except 7.5mg  on Tuesdays and Thursdays.  Recheck in 4 weeks.    Current Anticoagulation Instructions: INR 3.1 Today only take 1/2 pill then continue 1 pill everyday except 1.5 pills on Tuesdays and Thursdays. Recheck in 4 weeks.

## 2010-05-14 NOTE — Assessment & Plan Note (Signed)
Summary: ear pain/SD   Vital Signs:  Patient profile:   77 year old female Height:      64 inches Weight:      155.25 pounds BMI:     26.74 O2 Sat:      95 % on Room air Temp:     98 degrees F oral Pulse rate:   70 / minute BP sitting:   110 / 60  (left arm) Cuff size:   large  Vitals Entered By: Zella Ball Ewing CMA Duncan Dull) (March 06, 2010 4:41 PM)  O2 Flow:  Room air CC: Right Ear pain/RE   Primary Care Provider:  Tresa Garter MD  CC:  Right Ear pain/RE.  History of Present Illness: here with sister who seems more concerned than the pt regarding an episode of right ear pain last PM after she used a qtip, sharp, severe, lasted few minutes only, and np subsequent pain, sweling, blood or other d/c, hearing loss, dizzy.  Pt does have mild to mod sinus pain, pressure, fever and grenish d/c with mild ST as well worse with swallowing, and is wondering if the qtip in the ear somehow caused her throat to hurt;  no headache, chills, cough and Pt denies CP, worsening sob, doe, wheezing, orthopnea, pnd, worsening LE edema, palps, dizziness or syncope  Pt denies new neuro symptoms such as headache, facial or extremity weakness Pt denies polydipsia, polyuria.  No overt bleeding or bruising  Preventive Screening-Counseling & Management      Drug Use:  no.    Problems Prior to Update: 1)  Ear Pain, Right  (ICD-388.70) 2)  Sinusitis- Acute-nos  (ICD-461.9) 3)  Carotid Artery Disease  (ICD-433.10) 4)  Cystitis  (ICD-595.9) 5)  Edema  (ICD-782.3) 6)  Trigger Finger  (ICD-727.03) 7)  Tobacco Use, Quit  (ICD-V15.82) 8)  Hip Pain  (ICD-719.45) 9)  Hypothyroidism  (ICD-244.9) 10)  Atrial Fibrillation  (ICD-427.31) 11)  Anticoagulation Therapy  (ICD-V58.61) 12)  Low Back Pain  (ICD-724.2) 13)  Osteoporosis  (ICD-733.00) 14)  Hypertension  (ICD-401.9) 15)  Hyperlipidemia  (ICD-272.4) 16)  Diabetes Mellitus, Type II  (ICD-250.00) 17)  Coronary Artery Disease  (ICD-414.00) 18)   Congestive Heart Failure  (ICD-428.0) 19)  Anemia-nos  (ICD-285.9)  Medications Prior to Update: 1)  Coumadin 5 Mg Tabs (Warfarin Sodium) .... As Dirrected 2)  Crestor 20 Mg  Tabs (Rosuvastatin Calcium) .... Once Daily 3)  Lopressor 50 Mg  Tabs (Metoprolol Tartrate) .Marland Kitchen.. 11/2 Two Times A Day 4)  Klor-Con M20 20 Meq Cr-Tabs (Potassium Chloride Crys Cr) .... Take 1 Tablet By Mouth Once A Day 5)  Vitamin-B Complex   Tabs (B Complex Vitamins) .... Once Daily 6)  Fish Oil 1000 Mg  Caps (Omega-3 Fatty Acids) .... Once Daily 7)  Vitamin D3 1000 Unit  Tabs (Cholecalciferol) .Marland Kitchen.. 1 Qd 8)  Vitamin C 500 Mg  Tabs (Ascorbic Acid) .... Once Daily 9)  Voltaren 1 % Gel (Diclofenac Sodium) .... Use Two Times A Day On A Joint(S) 10)  Levothroid 50 Mcg Tabs (Levothyroxine Sodium) .Marland Kitchen.. 1 By Mouth Once Daily For Thyroid 11)  Furosemide 40 Mg Tabs (Furosemide) .... 1/2 or 1 Tab By Mouth Q Am ( A Water Pill) Prn  Current Medications (verified): 1)  Coumadin 5 Mg Tabs (Warfarin Sodium) .... As Dirrected 2)  Crestor 20 Mg  Tabs (Rosuvastatin Calcium) .... Once Daily 3)  Lopressor 50 Mg  Tabs (Metoprolol Tartrate) .Marland Kitchen.. 11/2 Two Times A Day 4)  Klor-Con  M20 20 Meq Cr-Tabs (Potassium Chloride Crys Cr) .... Take 1 Tablet By Mouth Once A Day 5)  Vitamin-B Complex   Tabs (B Complex Vitamins) .... Once Daily 6)  Fish Oil 1000 Mg  Caps (Omega-3 Fatty Acids) .... Once Daily 7)  Vitamin D3 1000 Unit  Tabs (Cholecalciferol) .Marland Kitchen.. 1 Qd 8)  Vitamin C 500 Mg  Tabs (Ascorbic Acid) .... Once Daily 9)  Voltaren 1 % Gel (Diclofenac Sodium) .... Use Two Times A Day On A Joint(S) 10)  Levothroid 50 Mcg Tabs (Levothyroxine Sodium) .Marland Kitchen.. 1 By Mouth Once Daily For Thyroid 11)  Furosemide 40 Mg Tabs (Furosemide) .... 1/2 or 1 Tab By Mouth Q Am ( A Water Pill) Prn 12)  Azithromycin 250 Mg Tabs (Azithromycin) .... 2po Qd For 1 Day, Then 1po Qd For 4days, Then Stop  Allergies (verified): No Known Drug Allergies  Past History:  Past  Medical History: Last updated: 08/09/2009 Anemia-NOS Congestive heart failure  Dr Antoine Poche Coronary artery disease(ejection fraction 69%.      She had stenting of a vein graft with a Taxus stent in the past. She is      status post CABG with an SVG to the LAD and SVG to circumflex) Diabetes mellitus, type II Hyperlipidemia Hypertension Osteoporosis Rigth hip fx 2/5 Low back pain Dr Prince Rome Anticoagulation therapy Atrial fibrillation Hypothyroidism  Past Surgical History: Last updated: 07/13/2007 Total hip replacement R  Social History: Last updated: 03/06/2010 Single Former Smoker Alcohol use-no Drug use-no  Risk Factors: Smoking Status: quit (03/27/2009)  Social History: Single Former Smoker Alcohol use-no Drug use-no Drug Use:  no  Review of Systems       all otherwise negative per pt -    Physical Exam  General:  Well-developed,well-nourished,in no acute distress; alert,appropriate and cooperative throughout examination Head:  normocephalic and atraumatic.   Eyes:  vision grossly intact, pupils equal, and pupils round.   Ears:  bilat tm's with mild to mod erythema, right > left, canals clear, no foreign body, sinus tender bilat Nose:  nasal dischargemucosal pallor and mucosal edema.   Mouth:  pharyngeal erythema.  , wears dentures Neck:  supple and no masses.   Lungs:  normal respiratory effort and normal breath sounds.   Heart:  normal rate and regular rhythm.   Extremities:  no edema, no erythema    Impression & Recommendations:  Problem # 1:  SINUSITIS- ACUTE-NOS (ICD-461.9)  Her updated medication list for this problem includes:    Azithromycin 250 Mg Tabs (Azithromycin) .Marland Kitchen... 2po qd for 1 day, then 1po qd for 4days, then stop treat as above, f/u any worsening signs or symptoms   Problem # 2:  EAR PAIN, RIGHT (ICD-388.70)  Her updated medication list for this problem includes:    Azithromycin 250 Mg Tabs (Azithromycin) .Marland Kitchen... 2po qd for 1 day,  then 1po qd for 4days, then stop exam benign other than above, reassured, for mucinex otc as needed   Problem # 3:  HYPERTENSION (ICD-401.9)  Her updated medication list for this problem includes:    Lopressor 50 Mg Tabs (Metoprolol tartrate) .Marland Kitchen... 11/2 two times a day    Furosemide 40 Mg Tabs (Furosemide) .Marland Kitchen... 1/2 or 1 tab by mouth q am ( a water pill) prn  BP today: 110/60 Prior BP: 112/62 (09/25/2009)  Labs Reviewed: K+: 4.8 (03/05/2010) Creat: : 1.2 (03/05/2010)   Chol: 180 (03/05/2010)   HDL: 48.90 (03/05/2010)   LDL: 91 (03/05/2010)   TG: 199.0 (03/05/2010) stable  overall by hx and exam, ok to continue meds/tx as is   Complete Medication List: 1)  Coumadin 5 Mg Tabs (Warfarin sodium) .... As dirrected 2)  Crestor 20 Mg Tabs (Rosuvastatin calcium) .... Once daily 3)  Lopressor 50 Mg Tabs (Metoprolol tartrate) .Marland Kitchen.. 11/2 two times a day 4)  Klor-con M20 20 Meq Cr-tabs (Potassium chloride crys cr) .... Take 1 tablet by mouth once a day 5)  Vitamin-b Complex Tabs (B complex vitamins) .... Once daily 6)  Fish Oil 1000 Mg Caps (Omega-3 fatty acids) .... Once daily 7)  Vitamin D3 1000 Unit Tabs (Cholecalciferol) .Marland Kitchen.. 1 qd 8)  Vitamin C 500 Mg Tabs (Ascorbic acid) .... Once daily 9)  Voltaren 1 % Gel (Diclofenac sodium) .... Use two times a day on a joint(s) 10)  Levothroid 50 Mcg Tabs (Levothyroxine sodium) .Marland Kitchen.. 1 by mouth once daily for thyroid 11)  Furosemide 40 Mg Tabs (Furosemide) .... 1/2 or 1 tab by mouth q am ( a water pill) prn 12)  Azithromycin 250 Mg Tabs (Azithromycin) .... 2po qd for 1 day, then 1po qd for 4days, then stop  Patient Instructions: 1)  Please take all new medications as prescribed 2)  Continue all previous medications as before this visit  3)  You can also use Mucinex OTC or it's generic for congestion , and tylenol for pain 4)  Please schedule an appointment with your primary doctor as needed Prescriptions: AZITHROMYCIN 250 MG TABS (AZITHROMYCIN) 2po qd  for 1 day, then 1po qd for 4days, then stop  #6 x 1   Entered and Authorized by:   Corwin Levins MD   Signed by:   Corwin Levins MD on 03/06/2010   Method used:   Print then Give to Patient   RxID:   [id number]    Orders Added: 1)  Est. Patient Level IV [14782]

## 2010-05-14 NOTE — Miscellaneous (Signed)
Summary: Flu 2011   Clinical Lists Changes  Observations: Added new observation of FLU VAX: Historical (01/29/2010 16:17)      Immunization History:  Influenza Immunization History:    Influenza:  historical (01/29/2010)

## 2010-05-14 NOTE — Assessment & Plan Note (Signed)
Summary: 6 mo rov /nws   Vital Signs:  Patient profile:   77 year old female Height:      64 inches Weight:      153 pounds BMI:     26.36 Temp:     97.6 degrees F oral Pulse rate:   76 / minute Pulse rhythm:   regular Resp:     16 per minute BP sitting:   110 / 74  (left arm) Cuff size:   regular  Vitals Entered By: Lanier Prude, CMA(AAMA) (March 19, 2010 1:33 PM) CC: 6 mo f/u  Is Patient Diabetic? No   Primary Care Provider:  Tresa Garter MD  CC:  6 mo f/u .  History of Present Illness: The patient presents for a follow up of hypertension, anticoag., hyperlipidemia  The patient presents for a preventive health examination  Patient past medical history, social history, and family history reviewed in detail no significant changes.  Patient is physically active. Depression is negative and mood is good. Hearing is decreasedl, and able to perform activities of daily living. Risk of falling is low and home safety has been reviewed and is appropriate. Patient has normal height, she is overweight, and visual acuity is good w/glasses. Patient has been counseled on age-appropriate routine health concerns for screening and prevention. Education, counseling done.   Preventive Screening-Counseling & Management  Alcohol-Tobacco     Alcohol drinks/day: 0     Smoking Status: never  Caffeine-Diet-Exercise     Caffeine Counseling: not indicated; caffeine use is not excessive or problematic     Diet Counseling: not indicated; diet is assessed to be healthy     Does Patient Exercise: no     Exercise Counseling: to improve exercise regimen     Depression Counseling: not indicated; screening negative for depression  Hep-HIV-STD-Contraception     Hepatitis Risk: no risk noted     Sun Exposure-Excessive: no  Safety-Violence-Falls     Seat Belt Use: yes     Violence in the Home: no risk noted     Fall Risk Counseling: counseling provided; falls with injury  noted  Comments: N/A  Current Medications (verified): 1)  Coumadin 5 Mg Tabs (Warfarin Sodium) .... As Dirrected 2)  Crestor 20 Mg  Tabs (Rosuvastatin Calcium) .... Once Daily 3)  Lopressor 50 Mg  Tabs (Metoprolol Tartrate) .Marland Kitchen.. 11/2 Two Times A Day 4)  Klor-Con M20 20 Meq Cr-Tabs (Potassium Chloride Crys Cr) .... Take 1 Tablet By Mouth Once A Day 5)  Vitamin-B Complex   Tabs (B Complex Vitamins) .... Once Daily 6)  Fish Oil 1000 Mg  Caps (Omega-3 Fatty Acids) .... Once Daily 7)  Vitamin D3 1000 Unit  Tabs (Cholecalciferol) .Marland Kitchen.. 1 Qd 8)  Vitamin C 500 Mg  Tabs (Ascorbic Acid) .... Once Daily 9)  Voltaren 1 % Gel (Diclofenac Sodium) .... Use Two Times A Day On A Joint(S) 10)  Levothroid 50 Mcg Tabs (Levothyroxine Sodium) .Marland Kitchen.. 1 By Mouth Once Daily For Thyroid 11)  Furosemide 40 Mg Tabs (Furosemide) .... 1/2 or 1 Tab By Mouth Q Am ( A Water Pill) Prn  Allergies (verified): No Known Drug Allergies  Past History:  Past Medical History: Last updated: 08/09/2009 Anemia-NOS Congestive heart failure  Dr Antoine Poche Coronary artery disease(ejection fraction 69%.      She had stenting of a vein graft with a Taxus stent in the past. She is      status post CABG with an SVG to the  LAD and SVG to circumflex) Diabetes mellitus, type II Hyperlipidemia Hypertension Osteoporosis Rigth hip fx 2/5 Low back pain Dr Prince Rome Anticoagulation therapy Atrial fibrillation Hypothyroidism  Past Surgical History: Last updated: 07/13/2007 Total hip replacement R  Family History: Last updated: 02/23/2007 Family History High cholesterol Family History Hypertension  Social History: Last updated: 03/06/2010 Single Former Smoker Alcohol use-no Drug use-no  Social History: Smoking Status:  never Does Patient Exercise:  no Hepatitis Risk:  no risk noted Sun Exposure-Excessive:  no Seat Belt Use:  yes  Review of Systems  The patient denies anorexia, chest pain, dyspnea on exertion, abdominal  pain, fever, weight loss, weight gain, vision loss, decreased hearing, hoarseness, syncope, peripheral edema, prolonged cough, headaches, hemoptysis, melena, hematochezia, severe indigestion/heartburn, hematuria, incontinence, genital sores, muscle weakness, suspicious skin lesions, transient blindness, difficulty walking, depression, unusual weight change, abnormal bleeding, enlarged lymph nodes, angioedema, and breast masses.    Physical Exam  General:  Well-developed,well-nourished,in no acute distress; alert,appropriate and cooperative throughout examination Head:  normocephalic and atraumatic.   Eyes:  vision grossly intact, pupils equal, and pupils round.   Ears:  B WNL hard hearing Nose:  nasal dischargemucosal pallor and mucosal edema.   Mouth:  pharyngeal erythema.  , wears dentures Neck:  supple and no masses.   Lungs:  normal respiratory effort and normal breath sounds.   Heart:  normal rate and regular rhythm.   Abdomen:  Bowel sounds positive,abdomen soft and non-tender without masses, organomegaly or hernias noted. Msk:  No deformity or scoliosis noted of thoracic or lumbar spine.  R troch major is less tender Lumbar-sacral spine is tender to palpation over paraspinal muscles and painfull with the ROM L ring finger trigger  Extremities:  no edema Neurologic:  No cranial nerve deficits noted. Station and gait are normal. Plantar reflexes are down-going bilaterally. DTRs are symmetrical throughout. Sensory, motor and coordinative functions appear intact.A little ataxic gait c/w age .   Skin:  Intact without suspicious lesions or rashes Psych:  Cognition and judgment appear intact. Alert and cooperative with normal attention span and concentration. No apparent delusions, illusions, hallucinations   Impression & Recommendations:  Problem # 1:  Preventive Health Care (ICD-V70.0) Assessment Deteriorated Overall doing well, age appropriate education and counseling updated and  referral for appropriate preventive services done unless declined, immunizations up to date or declined, diet counseling done if overweight, urged to quit smoking if smokes, most recent labs reviewed and current ordered if appropriate, ecg reviewed or declined (interpretation per ECG scanned in the EMR if done); information regarding Medicare Preventation requirements given if appropriate.  I have personally reviewed the Medicare Annual Wellness questionnaire and have noted 1.   The patient's medical and social history 2.   Their use of alcohol, tobacco or illicit drugs 3.   Their current medications and supplements 4.   The patient's functional ability including ADL's, fall risks, home safety risks and hearing or visual             impairment. 5.   Diet and physical activities 6.   Evidence for depression or mood disorders The patients weight, height, BMI and visual acuity have been recorded in the chart I have made referrals, counseling and provided education to the patient based review of the above and I have provided the pt with a written personalized care plan for preventive services.  Problem # 2:  EDEMA (ICD-782.3) Assessment: Improved  Her updated medication list for this problem includes:    Furosemide  40 Mg Tabs (Furosemide) .Marland Kitchen... 1/2 or 1 tab by mouth q am ( a water pill) prn  Problem # 3:  HYPOTHYROIDISM (ICD-244.9) Assessment: Unchanged  Her updated medication list for this problem includes:    Levothroid 50 Mcg Tabs (Levothyroxine sodium) .Marland Kitchen... 1 by mouth once daily for thyroid  Problem # 4:  ATRIAL FIBRILLATION (ICD-427.31)  Her updated medication list for this problem includes:    Coumadin 5 Mg Tabs (Warfarin sodium) .Marland Kitchen... As dirrected    Lopressor 50 Mg Tabs (Metoprolol tartrate) .Marland Kitchen... 11/2 two times a day  Problem # 5:  ANTICOAGULATION THERAPY (ICD-V58.61) Assessment: Unchanged On the regimen of medicine(s) reflected in the chart    Problem # 6:  HYPERTENSION  (ICD-401.9) Assessment: Unchanged  Her updated medication list for this problem includes:    Lopressor 50 Mg Tabs (Metoprolol tartrate) .Marland Kitchen... 11/2 two times a day    Furosemide 40 Mg Tabs (Furosemide) .Marland Kitchen... 1/2 or 1 tab by mouth q am ( a water pill) prn  Problem # 7:  HEARING LOSS, MILD (ICD-389.9) Assessment: Deteriorated  Orders: Audiology (Audio)  Complete Medication List: 1)  Coumadin 5 Mg Tabs (Warfarin sodium) .... As dirrected 2)  Crestor 20 Mg Tabs (Rosuvastatin calcium) .... Once daily 3)  Lopressor 50 Mg Tabs (Metoprolol tartrate) .Marland Kitchen.. 11/2 two times a day 4)  Klor-con M20 20 Meq Cr-tabs (Potassium chloride crys cr) .... Take 1 tablet by mouth once a day 5)  Vitamin-b Complex Tabs (B complex vitamins) .... Once daily 6)  Fish Oil 1000 Mg Caps (Omega-3 fatty acids) .... Once daily 7)  Vitamin D3 1000 Unit Tabs (Cholecalciferol) .Marland Kitchen.. 1 qd 8)  Vitamin C 500 Mg Tabs (Ascorbic acid) .... Once daily 9)  Voltaren 1 % Gel (Diclofenac sodium) .... Use two times a day on a joint(s) 10)  Levothroid 50 Mcg Tabs (Levothyroxine sodium) .Marland Kitchen.. 1 by mouth once daily for thyroid 11)  Furosemide 40 Mg Tabs (Furosemide) .... 1/2 or 1 tab by mouth q am ( a water pill) prn  Other Orders: Medicare -1st Annual Wellness Visit [phone])  Patient Instructions: 1)  Please schedule a follow-up appointment in 6 months. 2)  BMP prior to visit, ICD-9: 3)  Hepatic Panel prior to visit, ICD-9: 4)  Lipid Panel prior to visit, ICD-9:790.29  401.1 5)  TSH prior to visit, ICD-9: 6)  HbgA1C prior to visit, ICD-9: 7)  CBC w/ Diff prior to visit, ICD-9:   Orders Added: 1)  Audiology [Audio] 2)  Medicare -1st Annual Wellness Visit [G0438] 3)  Est. Patient Level IV [95621]   Immunization History:  Tetanus/Td Immunization History:    Tetanus/Td:  historical (02/26/2005)   Immunization History:  Tetanus/Td Immunization History:    Tetanus/Td:  Historical (02/26/2005)

## 2010-05-14 NOTE — Letter (Signed)
Summary: Handout Printed  Printed Handout:  - Coumadin Instructions-w/out Meds 

## 2010-05-16 NOTE — Therapy (Signed)
Summary: [facility]   Imported By: Lester Dawson 04/09/2010 08:44:22  _____________________________________________________________________  External Attachment:    Type:   Image     Comment:   External Document

## 2010-05-16 NOTE — Medication Information (Signed)
Summary: [name]   Anticoagulant Therapy  Managed by: Geoffry Paradise, PharmD Referring MD: Rollene Rotunda MD PCP: Georgina Quint Plotnikov MD Supervising MD: Jens Som MD, Arlys John Indication 1: Atrial Fibrillation (ICD-427.31) Lab Used: [facility] Site: [facility] INR POC 2.3 INR RANGE 2 - 3  Dietary changes: no    Health status changes: no    Bleeding/hemorrhagic complications: no    Recent/future hospitalizations: no    Any changes in medication regimen? no    Recent/future dental: no  Any missed doses?: no       Is patient compliant with meds? yes       Allergies: No Known Drug Allergies  Anticoagulation Management History:      Positive risk factors for bleeding include an age of 1 years or older and presence of serious comorbidities.  The bleeding index is 'intermediate risk'.  Positive CHADS2 values include History of CHF, History of HTN, Age > 31 years old, and History of Diabetes.  The start date was 07/19/2004.  Her last INR was 1.1 RATIO.  Anticoagulation responsible provider: Jens Som MD, Arlys John.  INR POC: 2.3.  Cuvette Lot#: [id number].  Exp: 05/2011.    Anticoagulation Management Assessment/Plan:      The patient's current anticoagulation dose is Coumadin 5 mg tabs: as dirrected.  The target INR is 2 - 3.  The next INR is due 05/14/2010.  Anticoagulation instructions were given to patient.  Results were reviewed/authorized by Geoffry Paradise, PharmD.         Prior Anticoagulation Instructions: INR 2.2 Continue 5mg s everyday. Recheck in 4 weeks.   Current Anticoagulation Instructions: INR:  2.3  Your INR is at goal today.  Continue taking Coumadin 1 tablet every day and return to clinic in 4 weeks for another INR check.

## 2010-05-17 NOTE — Letter (Signed)
Summary: [facility]  [facility]   Imported By: Lennie Odor 04/16/2009 14:25:37  _____________________________________________________________________  External Attachment:    Type:   Image     Comment:   External Document

## 2010-05-22 NOTE — Medication Information (Signed)
Summary: ROV  Anticoagulant Therapy  Managed by: Weston Brass, PharmD Referring MD: Rollene Rotunda MD PCP: Tresa Garter MD Supervising MD: Myrtis Ser MD, Tinnie Gens Indication 1: Atrial Fibrillation (ICD-427.31) Lab Used: [facility] Site: [facility] INR POC 2.4 INR RANGE 2 - 3  Dietary changes: no    Health status changes: no    Bleeding/hemorrhagic complications: no    Recent/future hospitalizations: no    Any changes in medication regimen? no    Recent/future dental: no  Any missed doses?: no       Is patient compliant with meds? yes       Allergies: No Known Drug Allergies  Anticoagulation Management History:      The patient is taking warfarin and comes in today for a routine follow up visit.  Positive risk factors for bleeding include an age of 75 years or older and presence of serious comorbidities.  The bleeding index is 'intermediate risk'.  Positive CHADS2 values include History of CHF, History of HTN, Age > 25 years old, and History of Diabetes.  The start date was 07/19/2004.  Her last INR was 1.1 RATIO.  Anticoagulation responsible provider: Myrtis Ser MD, Tinnie Gens.  INR POC: 2.4.  Cuvette Lot#: [id number].  Exp: 04/2011.    Anticoagulation Management Assessment/Plan:      The patient's current anticoagulation dose is Coumadin 5 mg tabs: as dirrected.  The target INR is 2 - 3.  The next INR is due 06/11/2010.  Anticoagulation instructions were given to patient.  Results were reviewed/authorized by Weston Brass, PharmD.  She was notified by Stephannie Peters, PharmD Candidate .         Prior Anticoagulation Instructions: INR:  2.3  Your INR is at goal today.  Continue taking Coumadin 1 tablet every day and return to clinic in 4 weeks for another INR check.    Current Anticoagulation Instructions: INR 2.4  Coumadin 5 mg tablet - Continue 1 tablet daily

## 2010-06-03 DIAGNOSIS — I4891 Unspecified atrial fibrillation: Secondary | ICD-10-CM

## 2010-06-11 ENCOUNTER — Encounter (INDEPENDENT_AMBULATORY_CARE_PROVIDER_SITE_OTHER): Payer: Medicare Other

## 2010-06-11 ENCOUNTER — Encounter: Payer: Self-pay | Admitting: Cardiology

## 2010-06-11 DIAGNOSIS — Z7901 Long term (current) use of anticoagulants: Secondary | ICD-10-CM

## 2010-06-11 DIAGNOSIS — I4891 Unspecified atrial fibrillation: Secondary | ICD-10-CM

## 2010-06-20 NOTE — Medication Information (Signed)
Summary: rov/sp  Anticoagulant Therapy  Managed by: Windell Hummingbird, RN Referring MD: Rollene Rotunda MD PCP: Georgina Quint Plotnikov MD Supervising MD: Shirlee Latch MD, Jaleeya Mcnelly Indication 1: Atrial Fibrillation (ICD-427.31) Lab Used: [facility] Site: [facility] INR POC 2.4 INR RANGE 2 - 3  Dietary changes: no    Health status changes: no    Bleeding/hemorrhagic complications: no    Recent/future hospitalizations: no    Any changes in medication regimen? no    Recent/future dental: no  Any missed doses?: no       Is patient compliant with meds? yes       Allergies: No Known Drug Allergies  Anticoagulation Management History:      The patient is taking warfarin and comes in today for a routine follow up visit.  Positive risk factors for bleeding include an age of 75 years or older and presence of serious comorbidities.  The bleeding index is 'intermediate risk'.  Positive CHADS2 values include History of CHF, History of HTN, Age > 44 years old, and History of Diabetes.  The start date was 07/19/2004.  Her last INR was 1.1 RATIO.  Anticoagulation responsible provider: Shirlee Latch MD, Kainen Struckman.  INR POC: 2.4.  Cuvette Lot#: [id number].  Exp: 04/2011.    Anticoagulation Management Assessment/Plan:      The patient's current anticoagulation dose is Coumadin 5 mg tabs: as dirrected.  The target INR is 2 - 3.  The next INR is due 07/09/2010.  Anticoagulation instructions were given to patient.  Results were reviewed/authorized by Windell Hummingbird, RN.  She was notified by Windell Hummingbird, RN.         Prior Anticoagulation Instructions: INR 2.4  Coumadin 5 mg tablet - Continue 1 tablet daily   Current Anticoagulation Instructions: INR 2.4 Continue taking 1 tablet every day. Recheck in 4 weeks.

## 2010-07-09 ENCOUNTER — Ambulatory Visit (INDEPENDENT_AMBULATORY_CARE_PROVIDER_SITE_OTHER): Payer: Medicare Other | Admitting: *Deleted

## 2010-07-09 DIAGNOSIS — I4891 Unspecified atrial fibrillation: Secondary | ICD-10-CM

## 2010-07-09 DIAGNOSIS — Z7901 Long term (current) use of anticoagulants: Secondary | ICD-10-CM | POA: Insufficient documentation

## 2010-07-09 NOTE — Patient Instructions (Signed)
INR 2.2 Continue taking 1 tablet (5 mg) daily. Recheck in 4 weeks.

## 2010-07-10 NOTE — Progress Notes (Signed)
No return date. 

## 2010-08-06 ENCOUNTER — Ambulatory Visit (INDEPENDENT_AMBULATORY_CARE_PROVIDER_SITE_OTHER): Payer: Medicare Other | Admitting: *Deleted

## 2010-08-06 DIAGNOSIS — I4891 Unspecified atrial fibrillation: Secondary | ICD-10-CM

## 2010-08-06 LAB — POCT INR: INR: 2.1

## 2010-08-27 NOTE — Assessment & Plan Note (Signed)
[name]                            CARDIOLOGY OFFICE NOTE   [name], [name]                      MRN:          [id number]  DATE:02/23/2007                            DOB:          [birth year]    PRIMARY CARE PHYSICIAN:  Dr. Macarthur Critchley [name].   REASON FOR PRESENTATION:  Evaluate patient with atrial fibrillation.   HISTORY OF PRESENT ILLNESS:  Patient is a lovely 77 year old white  female who presents for evaluation of her atrial fibrillation.  At the  last visit, she was actually going a little bit slow on her rate, so I  held her digoxin.  Unfortunately, she is going much faster.  Rate is in  the 120s.  She may be a little anxious because she has a procedure  tomorrow on her right leg, which sounds like a nerve block.  This is for  some pain she is having in her right leg.  For this procedure, she has  been holding her Coumadin for a week.  She does not notice palpitations.  She has had no presyncope or syncope.  She has had no chest discomfort,  neck or arm discomfort.  She has had no shortness of breath, PND, or  orthopnea.   PAST MEDICAL HISTORY:  1. Coronary artery disease (EF 69%, stenting of a vein graft with a      TAXUS stent in the past.  She is status post CABG with an SVG to      the LAD, and SVG to circumflex).  2. Permanent atrial fibrillation.  3. Hypertension.  4. Dyslipidemia.  5. Hemorrhoidectomy.  6. Hysterectomy.  7. Osteoporosis.   ALLERGIES:  NONE.   CURRENT MEDICATIONS:  1. Multivitamin.  2. Fosamax.  3. Crestor 20 mg daily.  4. Vitamin B.  5. Fish oil.  6. Calcium.  7. Vitamin C.  8. Lopressor 50 mg b.i.d.  9. Niferex.  10.Levothyroxine 25 mcg daily.   REVIEW OF SYSTEMS:  As stated in the HPI, and otherwise negative for  other systems.   PHYSICAL EXAMINATION:  Patient is in no distress.  Blood pressure 134/78.  Heart rate 120 and irregular.  Weight 149  pounds.  Body mass index 25.  HEENT:   Eyelids unremarkable.  Pupils equal, round, and reactive to  light.  Fundi not visualized.  Oral mucosa unremarkable.  NECK:  No jugular venous distention at 45 degrees.  Carotid upstroke  brisk and symmetric.  No bruits.  No thyromegaly.  LYMPHATICS:  No cervical, axillary or inguinal adenopathy.  LUNGS:  Clear to auscultation bilaterally.  BACK:  No costovertebral angle tenderness.  CHEST:  Unremarkable.  HEART:  PMI not displaced or sustained.  S1 and S2 within normal limits.  No S3.  No S4.  No clicks.  No rubs.  No murmurs.  ABDOMEN:  Flat.  Positive bowel sounds.  Normal in frequency and pitch.  No bruits.  No rebound.  No guarding.  No midline pulsatile mass.  No  hepatomegaly.  No splenomegaly.  SKIN:  No rashes.  No nodules.  NEUROLOGIC:  Oriented to person,  place, and time.  Cranial nerves 2  through 12 grossly intact.  Motor grossly intact.  EKG:  Atrial fibrillation with rapid ventricular rate.  Axis within  normal limits.  Intervals within normal limits.  No acute ST-T wave  changes.   ASSESSMENT AND PLAN:  1. Atrial fibrillation.  The patient's rate is actually increased now.      I am going to increase her metoprolol to 75 mg b.i.d.  Going      forward, I will reevaluate with a Holter monitor.  2. Hypertension.  Blood pressure is well controlled.  She will      continue the medications as listed.  3. Coumadin therapy.  Patient has this on hold for now.  She is to      restart it tomorrow, and will follow up with the [facility].  4. Coronary disease.  She is going to continue with secondary risk      reduction.  Her recent lipid profile demonstrated an LDL of 83, and      HDL of 51.5.  I think this is an acceptable ratio.  She will      continue Crestor as listed.  5. Hypothyroidism.  She is on thyroid replacement.  6. Followup.  I will see the patient back in about 2 months to further      adjust her rate control.     Renee Rotunda, MD, [facility]  Electronically  Signed    JH/MedQ  DD: 02/23/2007  DT: 02/23/2007  Job #: [id number]   cc:   Renee Quint. Plotnikov, MD

## 2010-08-27 NOTE — Assessment & Plan Note (Signed)
[facility]                            CARDIOLOGY OFFICE NOTE   [name], [name]                      MRN:          [id number]  DATE:11/11/2007                            DOB:          1920-12-09    PRIMARY CARE PHYSICIAN:  Georgina Quint. Plotnikov, MD   REASON FOR PRESENTATION:  Evaluate the patient with atrial fibrillation  and coronary artery disease.   HISTORY OF PRESENT ILLNESS:  The patient is now 77 years old.  She has  done quite well, since I last saw her.  She has had no palpitations,  presyncope, or syncope.  She has had no chest pain or shortness of  breath.  She does not exercise very much, but she climbs stairs in her 3-  level house frequently during the day without limitations.   PAST MEDICAL HISTORY:  1. Coronary artery disease (EF 69%.  Stenting of a vein graft to the      Taxus stent in the past.  She is status post CABG with saphenous      vein graft to the LAD and saphenous vein graft to circumflex).  2. Permanent atrial fibrillation.  3. Hypertension.  4. Dyslipidemia.  5. Hemorrhoidectomy.  6. Hysterectomy.  7. Osteoporosis.   ALLERGIES:  None.   MEDICATIONS:  1. Coumadin.  2. Multivitamin.  3. Fosamax.  4. Crestor 20 mg daily.  5. Vitamin.  6. Fish oil.  7. Calcium.  8. Vitamin C.  9. Levothyroxine 25 mcg daily.  10.Lopressor 50 mg b.i.d.  11.Vitamin D3.   REVIEW OF SYSTEMS:  As stated in the HPI and otherwise negative for  other systems.   PHYSICAL EXAMINATION:  GENERAL:  The patient is in no distress.  VITAL SIGNS:  Blood pressure 115/77, heart rate 69 and irregular, weight  146 pounds, and body mass index 25.  HEENT:  Eyes are unremarkable; pupils equal, round, and reactive to  light; fundi not visualized.  NECK:  No jugular venous distention at 45 degrees; carotid upstroke  brisk and symmetric; no bruits, no thyromegaly.  LYMPHATICS:  No adenopathy.  LUNGS:  Clear to auscultation bilaterally.  BACK:   No costovertebral angle tenderness.  HEART:  PMI not displaced or sustained; S1 and S2 within normal limits;  no S3, and no murmurs.  ABDOMEN:  Flat; positive bowel sounds; normal in frequency and pitch; no  bruits, rebound, guarding, or midline pulsatile mass; no hepatomegaly,  no splenomegaly.  SKIN:  No rashes, no nodules.  EXTREMITIES:  Pulses 2+, no edema.   EKG, atrial fibrillation, axis within normal limits, intervals within  normal limits, and no acute ST-T wave changes.   ASSESSMENT AND PLAN:  1. Atrial fibrillation.  The patient has reasonable rate control.  She      is tolerating Coumadin.  No further cardiovascular testing is      suggested.  2. Coronary artery disease.  She is having no symptoms.  She will      continue secondary risk reduction.  3. Carotid stenosis.  The patient has some mild 0-39% stenosis 6 years  ago in her carotids.  This needs to be reevaluated and she will      have carotid Doppler.  4. Hypertension.  Blood pressure is well controlled.  She will      continue on medications as listed.  5. Dyslipidemia per Dr. Posey Rea with a goal LDL less than 100 and      HDL greater than 40.  I reviewed these labs and she had an      excellent lipid profile recently.  6. Followup.  I will see her back in 6 months or sooner if needed.     Rollene Rotunda, MD, [facility]  Electronically Signed    JH/MedQ  DD: 11/11/2007  DT: 11/12/2007  Job #: [id number]   cc:   Georgina Quint. Plotnikov, MD

## 2010-08-27 NOTE — Consult Note (Signed)
[name], [name]               ACCOUNT NO.:  [id number]   MEDICAL RECORD NO.:  [id number]          PATIENT TYPE:  INP   LOCATION:  NA                           FACILITY:  [facility]   PHYSICIAN:  Madelynn Done, MD  DATE OF BIRTH:  02-13-1921   DATE OF CONSULTATION:  10/05/2006  DATE OF DISCHARGE:                                 CONSULTATION   REASON FOR CONSULTATION:  Evaluate right hand for possible compartment  syndrome.   REQUESTING PHYSICIAN:  Georgina Quint. Plotnikov, M.D.   HISTORY:  [name] is a 77 year old right-hand-dominant female who  was at home last evening and fell and tripped and landed on her right  hand.  The patient noted some immediate swelling to her dorsum of her  right hand and had increased pain and difficulty sleeping last night on  October 04, 2006.  She presented to the office of Dr. Posey Rea on the  morning of October 05, 2006 for evaluation of her right hand.  She was sent  to the emergency department for evaluation and admission for the concern  of a compartment syndrome.  I was consulted for the right hand.  The  patient complains of 10/10 pain in the right hand, inability to lay  still because of the pain in her hand.  She denies any numbness and  tingling in her median ulnar nerve distribution.  No prior history of  trauma or surgeries to the right hand.   PAST MEDICAL HISTORY:  1. Coronary artery disease.  2. Atrial fibrillation on chronic anticoagulation therapy.  3. High blood pressure.  4. Low thyroid.   PAST SURGICAL HISTORY:  1. Right hip fracture.  2. Coronary artery bypass grafting x3 vessels greater than 10 years      ago.   MEDICATIONS:  See medical chart.   ALLERGIES:  None.   SOCIAL HISTORY:  She is a nonsmoker.  She is able to perform her ADLs.  She has a sister that lives here in town as well as a brother.   REVIEW OF SYSTEMS:  No recent illnesses or hospitalization.   PHYSICAL EXAMINATION:  GENERAL:  She is a  healthy-appearing, 77-year-  old, white female who appears uncomfortable.  VITAL SIGNS:  She is afebrile.  She has a blood pressure of 146/83  .  She has a regular pulse rate.  EXTREMITIES:  On examination the right upper extremity, she does have a  large amount of swelling and edema in the right hand, large area of  ecchymosis extending from that MCP joint to the distal wrist crease.  Her compartments are tense and swollen.  She does have pain with flexion  and extension of her digits.  Her fingertips are cool but they do have  sluggish capillary refill.  Sensation to light touch is present along  the median ulnar nerve distribution.  She is able to gently flex her  digits, flex her thumb and FPL, and  extend her thumb.  These all do  cause pain.  She has some slight weeping of the skin from the soft  tissue  edema.  Her compartments in her forearm are soft.  She has no  pain with passive motion of her elbow and slight discomfort with forearm  supination and pronation.  She has a small Dupuytren's type nodule on  her right small finger at the MP region.  No other abnormalities in the  palmar surface of the hand.   RADIOGRAPHY:  AP and lateral films of the right hand do show the  significant soft tissue edema in the dorsal part of the hand.  Do not  see definitive fracture of the distal radius or metacarpal or phalanges.   IMPRESSION:  Right hand likely compartment syndrome.   PROCEDURE:  In the emergency department using the [employer] compartment  pressure monitor, compartment pressures were then checked in the dorsal  compartments and volar compartments.  Her pressures were greater than 54  mmHg on the dorsum as well as greater than 40 on the volar surface.  These were checked in the hypothenar and thenar regions.   PLAN:  The patient does have likely a compartment syndrome of the hand  from a fall and a chronic anticoagulation therapy.  She has an INR 4.6.  We talked her primary care  physician and we are going to give her some  FFP to help reverse the effects of the Coumadin.  We talked today about  the emergent release of her compartments in the dorsum and volar aspect  of the hand.  I went over the surgical procedure with the patient.  I  talked about the risks of surgery to include but not limited to  bleeding, infection, nerve damage, need for further surgery, loss of  motion in the hand and wrist and possibly even loss of the hand.  The  patient's questions were answered.  We will proceed to the operating  room.  She will likely need to return to the OR for repeat washout and  closure over the next couple days.  All questions were addressed with  the patient.  The patient voiced understanding of the plan.      Madelynn Done, MD  Electronically Signed     FWO/MEDQ  D:  10/05/2006  T:  10/06/2006  Job:  [id number]

## 2010-08-27 NOTE — Discharge Summary (Signed)
[name], [name]               ACCOUNT NO.:  [id number]   MEDICAL RECORD NO.:  [id number]          PATIENT TYPE:  INP   LOCATION:  1534                         [facility]ACILITY[facility]   PHYSICIAN:  Rosalyn Gess. Norins, MD  DATE OF BIRTH:  1920/07/27   DATE OF ADMISSION:  10/06/2006  DATE OF DISCHARGE:  10/09/2006                               DISCHARGE SUMMARY   ADMITTING DIAGNOSIS:  Compartment syndrome traumatic, right hand.   DISCHARGE DIAGNOSES:  Compartment syndrome traumatic, right hand.   CONSULTANTS:  Dr. Melvyn Novas for orthopedic surgery.   PROCEDURES:  1. Incision and drainage on the night of admission.  2. Re-exploration incision and closure on June 25.  3..  Plain hand x-ray October 05, 2006 which showed no fracture.   HISTORY OF PRESENT ILLNESS:  The patient is an 77 year old woman on  chronic anticoagulation who presented to the office on the day of  admission because of hand pain and swelling.  She had been chasing her  dog in the rain, slipped and fell on wet mulch, fell on her right side  striking her hand and head.  She had no other injury.  There was no loss  of consciousness.  On the to the morning of admission she woke with  extreme swelling of the right hand with pain unable to remove her ring.  Given her anticoagulation status it was felt she had compartment  syndrome.  She was referred to the hospital for evaluation.   Please see the H&P for past medical history, family history, social  history and medications.   HOSPITAL COURSE:  The patient was seen by Dr. Melvyn Novas at time of  admission and she was taken on the day of admission to the operating  room for compartment release of the right hand.  She was given FFP  because of her anticoagulated state.  Postop the patient did well known  in a bulky stressing with hand elevation.  Her lab work remained stable.  Her INR was down to 2.6.  She did have a mildly elevated CK at 1578  secondary to trauma rather than  cardiac causes. The patient was on this  account hydrated.  The patient was taken back to the operating room on  the 25th for right hand debridement and delayed primary closure that was  done uneventfully.  With the patient having done well through surgery  and otherwise being medically stable, she was thought to be ready for  discharge home on the 27th.   DISCHARGE EXAMINATION:  VITAL SIGNS:  Temperature was 98.8, blood  pressure 132/56, pulse 86, respirations were 18.  GENERAL APPEARANCE:  Pleasant woman in bed in no acute distress.  EXTREMITIES:  The patient's right hand is in a bulky dressing in an  elevated position.  The rest of her exam was unremarkable, unchanged.   FINAL LABORATORY DATA:  BMET from June 25 was unremarkable with a  glucose of 141, potassium of 3.6.  The patient's final PT was on the  24th at 2.6.  Final CBC on June 24 revealed a hemoglobin of 8.7 grams,  white count was 8100.   DISCHARGE MEDICATIONS:  1. The patient is to resume her home Coumadin regimen with follow-up      of her protime at the coag clinic or her usual point of care.  2. Alendronate 70 mg weekly.  3. Crestor 20 mg daily.  4. Lanoxin 0.125 mg daily.  5. Metoprolol 50 mg daily.  6. Levoxyl 25 mcg daily.   DISPOSITION:  The patient is to be discharged home. She will have home  health nursing care for dressing changes and monitoring.  The patient  does have instructions from Dr. Melvyn Novas to see him in the office on  Tuesday, July 1 for followup.   The patient will return to Dr. Posey Rea on an as-needed basis.   The patient's condition at time of discharge dictation is stable and  improved.      Rosalyn Gess Norins, MD  Electronically Signed     MEN/MEDQ  D:  10/09/2006  T:  10/09/2006  Job:  [id number]   cc:   Georgina Quint. Plotnikov, MD  [address]  [place]  [place] [address]   Madelynn Done, MD  Fax: [phone]

## 2010-08-27 NOTE — Op Note (Signed)
NAMEYALISSA, FINK               ACCOUNT NO.:  [id number]   MEDICAL RECORD NO.:  [id number]          PATIENT TYPE:  INP   LOCATION:  1534                         FACILITY:  [facility]   PHYSICIAN:  Madelynn Done, MD  DATE OF BIRTH:  February 20, 1921   DATE OF PROCEDURE:  10/07/2006  DATE OF DISCHARGE:                               OPERATIVE REPORT   PREOPERATIVE DIAGNOSIS:  1. Right hand compartment syndrome.  2. Chronic anticoagulation therapy.   POSTOPERATIVE DIAGNOSIS:  1. Right hand compartment syndrome.  2. Chronic anticoagulation therapy.   PROCEDURE:  1. Right hand debridement of skin and subcutaneous tissue dorsum and      volar aspect of the hand.  2. Right hand fasciotomy delayed primary wound closure.   ATTENDING SURGEON:  Madelynn Done, M.D., who was scrubbed and  present for the entire procedure.   ASSISTANT SURGEON:  None.   ANESTHESIA:  General   ESTIMATED BLOOD LOSS:  Minimal.   TOURNIQUET TIME:  None.   SURGICAL INDICATIONS:  Ms. Selner is an 77 year old right hand dominant  female who was taken to the operating room emergently on October 05, 2006,  for compartment syndrome of the right hand secondary to a fall and  elevated compartment pressures and evacuation of her hematoma.  The  patient tolerated that procedure well.  Drains were left in place.  The  patient was brought back to the operating room for scheduled evaluation  of the incision sites and possible delayed primary closure.  The risks  of surgery were explained in detail with the patient.  A signed informed  consent was obtained on the day of procedure.   DESCRIPTION OF PROCEDURE:  The patient was properly identified in the  preoperative holding area, a mark was made on the right upper extremity  indicating the correct operative side.  The patient was then brought  back to the operating room and placed supine on the anesthesia table.  General anesthesia was administered via endotracheal tube.   The patient  tolerated this well.  A well padded tourniquet was then placed on the  right brachium and sealed with a 1000 drape.  The right upper extremity  was then prepped with Betadine and sterilely draped.  A time-out was  called and the correct side was identified and the surgical procedure  was then begun.   The previous drains were removed and sutures were also removed.  Those  instruments were passed off the back table.  The muscle tissue was  inspected and noted to be healthy and viable on the volar and dorsal  aspect of the hand.  Debridement of the skin and subcutaneous tissues  were then done, more so dorsally to evacuate some residual hematoma.  After debridement and thorough irrigation with 6 liters of lactated  Ringer's solution, both dorsal and volar wounds were then closed with  simple sutures and horizontal mattress sutures on the volar side.  The  hypothenar incision was then closed with 4-0 nylon suture, this measured  approximately 3 cm. The thenar incision measured 3 cm and was closed.  The carpal tunnel incision was also closed with 4-0 nylon suture.   Attention was then turned to the dorsum of the hand where the  longitudinal incisions between the index and long finger metacarpal and  small and ring finger metacarpal were then closed with interrupted  horizontal mattress 5-0 nylon sutures as well as simple sutures.  These  wounds measured 8 cm in length.  After completion of the wound closure,  0.25% Marcaine 10 mL were then used to block the median nerve at the  level of the wrist as well as portions were used to anesthetize the  thenar and hypothenar incisions.  The hand was then cleansed with  peroxide. Adaptic dressings were then applied, both dorsally and  volarly.  A bulky hand dressing was then applied.  The patient was then  extubated and taken to recovery room in good condition.  The patient  tolerated this procedure well without any intraoperative  complications.   POSTOPERATIVE PLAN:  The patient be admitted to continue on the medical  service.  We will consult OT for digital of motion and edema control.  Will continue to follow wounds as an inpatient and on an outpatient  basis.      Madelynn Done, MD  Electronically Signed     FWO/MEDQ  D:  10/07/2006  T:  10/07/2006  Job:  [id number]

## 2010-08-27 NOTE — Op Note (Signed)
[name], [name]               ACCOUNT NO.:  [id number]   MEDICAL RECORD NO.:  [id number]          PATIENT TYPE:  INP   LOCATION:  NA                           FACILITY:  [facility]   PHYSICIAN:  Madelynn Done, MD  DATE OF BIRTH:  23-Aug-1920   DATE OF PROCEDURE:  10/05/2006  DATE OF DISCHARGE:                               OPERATIVE REPORT   PREOPERATIVE DIAGNOSES:  1. Right hand compartment syndrome.  2. Chronic anticoagulation therapy INR greater than 4.   POSTOPERATIVE DIAGNOSES:  1. Right hand compartment syndrome.  2. Chronic anticoagulation therapy INR greater than 4.   ATTENDING SURGEON:  Dr. Bradly Bienenstock, who was scrubbed and present for  the entire procedure.   ASSISTANT SURGEON:  None.   PROCEDURE:  1. Right hand compartment releases, two dorsal incisions one incision      over the thenar and one incision over the hypothenar.  2. Right carpal tunnel release.   TOURNIQUET TIME:  Less than 15 minutes at 250 mmHg for the carpal tunnel  release.   ANESTHESIA:  General via endotracheal tube.   COMPLICATIONS:  None.   DRAINS:  Five small 1/4 inch Penrose drains, one for each incision.   SURGICAL INDICATIONS:  [name] is an 77 year old right-hand-dominant  female presented to the emergency department with signs and symptoms  consistent with a right hand compartment syndrome.  The patient fell  yesterday evening and was having 10/10 pain as well as tight and swollen  compartments.  The patient fell last evening and presented with a tight  and swollen right hand with 10/10 pain.  The patient seen evaluated  emergency department and compartment pressures were checked greater then  50 mmHg of the dorsum of the hand and it was felt that she had a  compartment syndrome of the right hand.  Risks, benefits and  alternatives were discussed in detail with the patient and signed  informed consent was obtained to proceed with the above procedures.  Risks include but not  limited to bleeding, infection, skin necrosis,  stiffness in the digits, need for further surgery and loss of the hand.  Signed informed consent was obtained prior to proceeding to the  operating room.   INTRAOPERATIVE FINDINGS:  The patient did have a very large hematoma on  the dorsal part of her right hand and with the muscle appeared viable  over the dorsal part of the hand as well as the thenar and hypothenar  musculature.  There was no blood within the carpal canal and the median  nerve appeared edematous.   DESCRIPTION OF PROCEDURE:  The patient was properly identified in the  preoperative holding area and a mark with a permanent marker was made on  the right hand to indicate correct operative site.  The patient then  brought back to the operating room, placed supine on the anesthesia  table where general anesthesia was administered.  The patient tolerated  the procedure well.  The patient received preoperative antibiotics prior  to any skin incisions.  The right upper extremity, well-padded  tourniquet was then placed  on the right brachium and sealed with a 1000  drape.  Right upper extremity was then prepped with Betadine and  sterilely draped.  Time-out was called the correct site was identified  and the surgical procedure was then begun.  Two incisions were made over  the dorsal part of the hand between the index and long finger and the  small finger and ring finger.  Dissection was carried down through the  skin and subcutaneous tissues and large of hematoma was evacuated both  proximally and distally.  Using a blunt dissection done and a Market researcher, dissection was then carried down to release the interossei  between the index and long finger, ring and small finger.  Through the  radial incision and using a Therapist, nutritional, a small rent was then placed  in the adductor compartment to release the thumb adductor compartment.  After the compartment releases were completed  on the dorsum of the hand,  the volar surface was then attended to, small incision was then made  over the thenar eminence and hypothenar eminence for dissection was  carried down through the skin and subcutaneous tissue.  The fascia was  then incised and opened with a healthy appearing muscle.  Incision was  then made in the mid palm and extended ulnarly across the wrist crease  to expose the median nerve for the carpal tunnel release.  The  dissection was carried down through the skin and subcutaneous tissues,  blunt retractors were used on the ulnar side.  The transverse dissection  was carried down through the fascia and the transverse carpal ligament  was then identified and under direct visualization, the transverse  carpal ligament was released completely both proximally and distally.  There was noted to be a moderate amount of blood within the carpal  canal.  After release of the compartments of the hand, the digits were  then inspected and did not feel it was necessary to do individual  digital fasciotomies.  The fingertips were warm and with better cap  refill.  After all the fasciotomies were then completed, the wounds were  then thoroughly irrigated with saline solution.  Penrose drains were  then placed into all the wounds and the wounds were then lightly tacked  with one 4-0 nylon suture for each incision on the dorsum of the hand  and thenar and hypothenar and two sutures were then placed over the  carpal tunnel incision.  A bulky hand dressing was then applied to the  hand.  There was good perfusion to the digits.  The small volar resting  hand splint was then applied.  The patient was extubated and taken to  recovery room in good condition.   POSTOPERATIVE PLAN:  The patient be admitted for anticoagulants to get  her INR under better control.  She received 2 units of FFP during the procedure.  Will check her INR and PT in the morning.  Her primary care  physician is the  admitting physician.  She will likely need to be  returned back to the operating room in about 48 hours for repeat wound  evacuation of hematoma and potential delayed primary closure.  She may  require multiple operations on the hand and depending on how the soft  tissues heal especially the skin on the dorsum part of the hand which  was very friable and which had most of the hematoma.      Madelynn Done, MD  Electronically Signed  FWO/MEDQ  D:  10/05/2006  T:  10/06/2006  Job:  [id number]

## 2010-08-27 NOTE — H&P (Signed)
NAMEROXI, HLAVATY               ACCOUNT NO.:  [id number]   MEDICAL RECORD NO.:  [id number]          PATIENT TYPE:  INP   LOCATION:  1534                         FACILITY:  [facility]   PHYSICIAN:  Georgina Quint. Plotnikov, MDDATE OF BIRTH:  [birth year]   DATE OF ADMISSION:  10/05/2006  DATE OF DISCHARGE:                              HISTORY & PHYSICAL   CHIEF COMPLAINT:  Pain and swelling of the right hand.   HISTORY OF PRESENT ILLNESS:  The patient is an 77 year old female on  chronic anticoagulation who presents ad hoc today with the above  complaints.  Last night in the rain, she ran after her dog, slipped on  the wet mulch, and fell on the right side, hit her right hand.  There  was no other injury, no head injury or loss of consciousness.  This  morning, she woke up with extreme swelling of the right hand, pain,  unable to remove the ring.  The hematoma is getting bigger.   PAST MEDICAL HISTORY:  1. Dyslipidemia.  2. Hypertension.  3. Atrial fibrillation.  4. Chronic anticoagulation.   FAMILY HISTORY:  Positive for heart disease.   SOCIAL HISTORY:  She comes in with a friend today.  Does not smoke.  No  alcohol.   ALLERGIES:  No known drug allergies.   CURRENT MEDICATIONS:  1. Coumadin as directed.  2. Alendronate 70 mg weekly.  3. Crestor 20 mg daily.  4. Lanoxin 0.125 mg daily.  5. Metoprolol 50 mg daily.  6. Levoxyl 25 mcg daily.   REVIEW OF SYSTEMS:  No chest pain or shortness of breath.  No  palpitations.  No neurologic complaints.  No headache.  The rest of the  18-point review of systems is negative.   PHYSICAL EXAMINATION:  VITAL SIGNS:  Blood pressure 122/74, pulse 91,  temperature 96.9, weight 156 pounds.  GENERAL:  She is in no acute distress.  HEENT:  Moist mucosa.  LUNGS:  Clear.  No wheezes or rales.  HEART:  Irregularly irregular rhythm and rate.  ABDOMEN:  Soft, nontender.  LOWER EXTREMITIES:  Without edema.  NEUROLOGIC:  She is alert, oriented,  cooperative.  Denies being  depressed.  SKIN:  Clear.  UPPER EXTREMITIES:  Examination of the right arm reveals significant  enlargement of the arm and the wrist.  Large hematoma dorsally overlying  the dorsal palm and the wrist area.  Imbedded ring on the right fourth  finger.  The extremity is cold.  Unable to palpate the pulse.  No bluish  discoloration.  Sensory exam is decreased throughout.   LABORATORIES:  None available.   ASSESSMENT AND PLAN:  1. Right hand hematoma/contusion, with very significant      circumferential edema.  Will admit for stat CT scan or MRI.  X-rays      to rule out fracture.  Orthopedic consult to rule out compartment      syndrome.  2. Imbedded ring right fourth finger.  Will remove with the help of a      nylon thread and lubricant.  3. Anticoagulation with Coumadin, chronic.  Obtain  INR.  Will reverse      with fresh frozen plasma and vitamin K if needed for surgery.  4. Hypothyroidism, on treatment.  5. Atrial fibrillation, rate controlled.      Georgina Quint. Plotnikov, MD  Electronically Signed     AVP/MEDQ  D:  10/05/2006  T:  10/06/2006  Job:  [id number]

## 2010-08-27 NOTE — Assessment & Plan Note (Signed)
[facility]                            CARDIOLOGY OFFICE NOTE   ALEIA, LAROCCA                      MRN:          [id number]  DATE:05/11/2007                            DOB:          1920/08/03    PRIMARY CARE PHYSICIAN:  Georgina Quint. Plotnikov, MD.   REASON FOR PRESENTATION:  Evaluate patient with atrial fibrillation and  coronary disease.   HISTORY OF PRESENT ILLNESS:  The patient is a pleasant 77 year old who  presents for follow-up.  I have been trying manage her tachybrady rate  with her atrial fibrillation.  She does not really notice the  palpitations except rarely.  She certainly does not have any presyncope  or syncope.  She gets winded climbing the stairs, but this has been a  chronic problem.  She has had no new symptoms, denies any PND or  orthopnea.  She had one episode of chest discomfort several weeks ago.  She took two sublingual nitroglycerin and this resolved.  She has had  none since then.  She does a little activity in her house but is not  particularly active.   PAST MEDICAL HISTORY:  1. coronary artery disease. EF 69%.  Stenting of the vein graft with a      Taxus stent in the past.  She is status CABG with a saphenous vein      graft to the LAD and saphenous vein graft to the circumflex).  2. Permanent atrial fibrillation.  3. Hypertension.  4. Dyslipidemia.  5. Hemorrhoidectomy.  6. Hysterectomy.  7. Osteoporosis.   ALLERGIES:  None.   MEDICATIONS:  Coumadin, multivitamin, Fosamax, Crestor 20 mg daily,  vitamin D, fish oil, calcium, vitamin C, Lopressor 75 mg b.i.d.,  Niferex, levothyroxine 25 mcg daily.   REVIEW OF SYSTEMS:  As stated in the HPI and otherwise negative for  other systems.   PHYSICAL EXAMINATION:  The patient is in no distress.  The blood pressure is 127/89, heart rate is 83 and irregular, weight 140  pounds, body mass index 24.  HEENT:  Eyelids unremarkable.  Pupils equal, round, reactive to  light.  Fundi not visualized.  Oral mucosa unremarkable.  NECK:  No jugular distention at 45 degrees.  Carotid upstroke brisk and  symmetrical.  No bruits, no thyromegaly.  LYMPHATICS:  No cervical, axillary or inguinal adenopathy.  LUNGS:  Clear to auscultation bilaterally.  BACK:  No costovertebral angle tenderness.  CHEST:  Unremarkable.  HEART:  PMI not displaced or sustained, S1 and S2 within normal limits.  No S3, no murmurs.  ABDOMEN:  Flat, positive bowel sounds, normal in frequency and pitch, no  bruits, no rebound, no guarding, no midline pulsatile mass, no  hepatomegaly, no splenomegaly.  SKIN:  No rashes, no nodules.  EXTREMITIES:  2+ pulses throughout, no edema, cyanosis or clubbing.  NEUROLOGIC:  Grossly intact.   ASSESSMENT/PLAN:  1. Atrial fibrillation.  The patient will get a Holter monitor to      judge whether she has adequate rate control on the current regimen.      She has had a problem with  tachybrady rates although she has not      had much in the way of symptoms.  She will remain on Coumadin.  2. Coronary disease.  She is not having any ongoing symptoms other      than the episode of chest discomfort several weeks ago.  No further      cardiovascular testing is suggested.  She will continue with risk      reduction.  3. Dyslipidemia per Dr. Posey Rea.  There are apparently some recent      labs and I will review these.  The goal would be an LDL of less and      100 and HDL greater than 50.  4. Follow-up.  I will see her back 6 months unless I need to make      further adjustments for rate control.     Rollene Rotunda, MD, [facility]  Electronically Signed    JH/MedQ  DD: 05/11/2007  DT: 05/12/2007  Job #: [id number]   cc:   Georgina Quint. Plotnikov, MD

## 2010-08-27 NOTE — Assessment & Plan Note (Signed)
[facility]                            CARDIOLOGY OFFICE NOTE   [name], [name]                      MRN:          [id number]  DATE:11/26/2006                            DOB:          Nov 19, 1920    PRIMARY:  Georgina Quint. Plotnikov, MD   REASON FOR PRESENTATION:  Evaluation patient with atrial fibrillation.   HISTORY OF PRESENT ILLNESS:  The patient is 77 years old and presents  for followup of her atrial fibrillation.  She had a fall in June and  hurt her hand and was in the hospital for several days.  She did not  have syncope.  She recalls the events very clearly.  She has not had any  palpitations, presyncope or syncope.  She has had no shortness of breath  or chest pain.   PAST MEDICAL HISTORY:  1. Coronary artery disease (EF 69%.  Stenting of a vein graft with a      Taxus stent in the past.  She is status post CABG with an SVG to      the LAD and SVG to circumflex).  2. Permanent atrial fibrillation.  3. Hypertension.  4. Dyslipidemia.  5. Hemorrhoidectomy.  6. Hysterectomy.  7. Osteoporosis.   ALLERGIES:  None.   MEDICATIONS:  1. Multivitamin.  2. Fosamax.  3. Crestor 20 mg daily.  4. B complex vitamin.  5. Fish oil.  6. Calcium.  7. Coumadin.  8. Digitek 0.125 mg daily.  9. Vitamin C.  10.Lopressor 15 mg b.i.d.  11.Niferex.  12.Levothyroxine 25 mcg daily.   REVIEW OF SYSTEMS:  As stated in the HPI and otherwise negative for  other systems.   PHYSICAL EXAMINATION:  The patient is in no distress.  Blood pressure is  110/68, heart rate 48 and irregular, weight 148 pounds, body mass index  26.  NECK:  No jugular venous distention at 45 degrees.  Carotid upstroke  brisk and symmetric.  No bruits, no thyromegaly.  LUNGS:  Clear to auscultation bilaterally.  HEART:  PMI not displaced or sustained.  S1 and S2 within normal limits.  No S3, no murmurs.  ABDOMEN:  Flat, positive bowel sounds normal in frequently and pitch.  No bruits, no rebound, no guarding.  No midline pulsatile mass.  No  organomegaly.  SKIN:  No rashes, no nodules.  EXTREMITIES:  Show 2+ pulses throughout.  No edema, no cyanosis, no  clubbing.  NEUROLOGIC:  Grossly intact.   EKG  Atrial fibrillation with slow ventricular rate.  Axis within normal  limits, intervals within normal limits, no acute ST-T wave change.   ASSESSMENT AND PLAN:  1. Atrial fibrillation.  The patient seems to have a somewhat low      rate.  I am going to stop the digoxin.  In the future I may do      another monitor on her to make sure she has not developed slower      rate than previously.  For now, she will remain on the beta      blocker.  She is having her Coumadin followed.  2. Coronary artery disease.  She is having no ongoing symptoms.  No      further cardiovascular testing is suggested.  She will continue      with her risk reduction.  3. Dyslipidemia.  She is due to get a lipid profile and will have this      done.  I will happy to review this.  4. Follow up patient in 6 months or sooner if needed.     Rollene Rotunda, MD, [facility]  Electronically Signed    JH/MedQ  DD: 11/26/2006  DT: 11/27/2006  Job #: [id number]   cc:   Georgina Quint. Plotnikov, MD

## 2010-08-30 NOTE — Discharge Summary (Signed)
[name], [name]                           ACCOUNT NO.:  [id number]   MEDICAL RECORD NO.:  [id number]                   PATIENT TYPE:   LOCATION:                                       FACILITY:   PHYSICIAN:  Vania Rea. Supple, M.D.               DATE OF BIRTH:   DATE OF ADMISSION:  05/27/2003  DATE OF DISCHARGE:  05/30/2003                                 DISCHARGE SUMMARY   ADMISSION DIAGNOSES:  1. Right femoral neck fracture.  2. Coronary artery disease status post coronary artery bypass graft and     recent stent in September of 2004.  3. Hypertension.  4. Hypercholesterolemia.  5. Osteoporosis.   DISCHARGE DIAGNOSES:  1. Right femoral neck fracture status post right hip hemiarthroplasty.  2. Postoperative hemorrhagic anemia requiring transfusion.  3. Coronary artery disease status post coronary artery bypass graft and     recent stent in September of 2004.  4. Hypertension.  5. Hypercholesterolemia.  6. Osteoporosis.   OPERATIONS:  Right cemented unipolar hemiarthroplasty.  Surgeon Francena Hanly, M.D., assistant Ralene Bathe P.A.-C. under general anesthesia.   BRIEF HISTORY:  [name] is an 77 year old female who had a fall  when walking into a store after tripping and has inability to bear weight  and foreshortened external rotator right lower extremity.  She was brought  to [facility] Emergency Room where x-rays demonstrated a displaced right  femoral neck fracture.  Risks and benefits discussed with the patient and  family and a hemiarthroplasty was recommended and she was in agreement and  wished to proceed.   HOSPITAL COURSE:  The patient was admitted and underwent the above-named  procedure and tolerated this well.  All appropriate antibiotics and  analgesics were utilized.  Postoperatively, she was placed on Coumadin for  DVT and PE prophylaxis.  She did experience postoperative hemorrhagic anemia  requiring transfusion and did well with this. She  lived with her brother who  is of ill health, therefore a rehab bed was sought and she was thought to be  an appropriate candidate for further rehab towards independence.  On  May 30, 2003, the patient was afebrile, vital signs were stable,  neurovascularly she was intact, and she at this time was stable for transfer  to rehab to continue therapies and inpatient care.   HOSPITAL LABORATORY DATA:  Admission hemoglobin 13.9, postoperatively down  to 8.6, after transfusion 10.9.  Chemistry within normal limits on  admission.  Mild hyponatremia postoperatively which resolved.  Glucoses  ranged from 127 to 162.  Urinalysis shows Klebsiella from a cath.  Blood  type O positive.  Two units blood transfusion on the chart.  EKG shows  normal sinus rhythm, nonspecific T wave abnormality.  X-rays show a right  hip film with a displaced femoral neck fracture.  Postoperative hip film  shows satisfactory post hip hemiarthroplasty.  X-ray of the chest  shows  borderline cardiomegaly, linear atelectasis, and mild bronchitic changes  otherwise no acute.   CONDITION ON DISCHARGE:  Stable, improved.   DISCHARGE MEDICATIONS AND PLANS:  The patient will be discharged to [facility].  She will continue the medications per MAR.  She is total  hip precautions, weightbearing as tolerated.  She may shower at 5 days  postoperatively.  She will follow up with [place] approximately 2 weeks after  discharge from the [facility]. We will continue to follow her  there through her recovery.  Continue her regular diet.     Tracy A. Shuford, P.A.-C.                 Vania Rea. Supple, M.D.    TAS/MEDQ  D:  06/08/2003  T:  06/08/2003  Job:  [id number]

## 2010-08-30 NOTE — Assessment & Plan Note (Signed)
[facility] HEALTHCARE                            CARDIOLOGY OFFICE NOTE   [name], [name]                      MRN:          [id number]  DATE:07/27/2006                            DOB:          09-16-1920    PRIMARY CARE PHYSICIAN:  Dr. Posey Rea.   REASON FOR PRESENTATION:  Evaluate patient with atrial fibrillation.   HISTORY OF PRESENT ILLNESS:  Patient returns for followup. She is now 77  years old. She has done well since I last saw her. She denies any  palpitations and has had no pre syncope or syncope. She has had no chest  pain or shortness of breath.   PAST MEDICAL HISTORY:  Coronary artery disease (ejection fraction 69%.  She had stenting of a vein graft with a Taxus stent in the past. She is  status post CABG with an SVG to the LAD and SVG to circumflex),  hypertension, hyperlipidemia, hemorrhoidectomy, hysterectomy,  osteoporosis, atrial fibrillation.   ALLERGIES:  None.   MEDICATIONS:  1. Multivitamin.  2. Aspirin 81 mg daily.  3. Fosamax 70 mg q.week.  4. Crestor 20 mg daily.  5. Vitamin B.  6. Fish oil.  7. Calcium.  8. Coumadin.  9. Digitek 0.125 mg daily.  10.Vitamin C.  11.Lopressor 50 mg b.i.d.  12.Levothyroxine 12.5 mcg daily.   REVIEW OF SYSTEMS:  As stated in the HPI and otherwise negative for  other systems.   PHYSICAL EXAMINATION:  Patient is in no distress. Blood pressure 126/80,  heart rate 59 and regular, weight 165 pounds.  NECK: No jugular venous distension, wave form within normal limits,  carotid upstrokes brisk and symmetric. No bruits or thyromegaly.  LUNGS: Clear to auscultation  bilaterally.  BACK: No costovertebral angle tenderness.  CHEST: Unremarkable.  HEART: PMI not displaced or sustained, S1 and S2 within normal limits.  No S3. No S4, clicks, rubs, or murmurs.  ABDOMEN: Flat, positive bowel sounds normal to frequency and pitch. No  bruits, rebound, or guarding. No midline pulsatile mass. No  organomegaly.  SKIN: No rashes. nodules  EXTREMITIES: 2 + pulse. No edema.   EKG: Atrial fibrillation, rate 59, axis within normal limits, intervals  within normal limits. No acute ST-T wave changes, her voltage is  diminished throughout compared with previous.   ASSESSMENT/PLAN:  1. Atrial fibrillation, patient is tolerating the Coumadin. No further      cardiovascular testing is suggested. She good rate control.  2. Coronary artery disease, I am going to take her off the aspirin as      most recent data suggests no benefit to the Coumadin plus aspirin      in patients with stable coronary disease. She will continue on the      Coumadin. These studies were done in patients not with a drug-      eluting stent. However, this patient has special circumstances in      that she has had some falls and hit her head and bumped her leg.      She has problems with bruising. I think it is most prudent then to  discontinue the aspirin. I will discuss this with colleagues.  3. Follow up, I will see the patient again in 6 months or sooner if      needed.     Rollene Rotunda, MD, [facility]  Electronically Signed    JH/MedQ  DD: 07/27/2006  DT: 07/27/2006  Job #: [id number]   cc:   Georgina Quint. Plotnikov, MD

## 2010-08-30 NOTE — Discharge Summary (Signed)
NAMEVIANKA, ERTEL                           ACCOUNT NO.:  [id number]   MEDICAL RECORD NO.:  [id number]                   PATIENT TYPE:  IPS   LOCATION:  4149                                 FACILITY:  [facility]   PHYSICIAN:  Erick Colace, M.D.           DATE OF BIRTH:  02-27-1921   DATE OF ADMISSION:  05/30/2003  DATE OF DISCHARGE:  06/06/2003                                 DISCHARGE SUMMARY   DISCHARGE DIAGNOSES:  1. Right hip hemiarthroplasty for fracture.  2. Coronary artery disease.  3. Postoperative anemia.  4. Urinary tract infection.  5. Osteoporosis.   HISTORY OF PRESENT ILLNESS:  [name] is an 77 year old female with  history of hypertension and angina who fell February 11 with inability to  bear weight and pain right lower extremity.  She was evaluated by Dr. Rennis Chris  at [facility] and noted to have displaced right femoral neck  fracture. She underwent right hip hemi the same day and postop is partial  weightbearing as tolerated with Coumadin for DVT prophylaxis.  She did  received 2 units of packed red blood cells secondary to postop anemia. She  has had PT/OT ongoing and is currently minimum assistance for transfers,  minimum assistance to close supervision ambulating 100 feet with standard  walker, maximum assistance for lower body care, supervision for upper body  care.   PAST MEDICAL HISTORY:  1. Coronary artery disease status post CABG in 1998, stenting in 2004.  2. External ________.  3. GERD.  4. Dry skin.  5. Osteoarthritis.  6. Hysterectomy.   ALLERGIES:  No known drug allergies.   SOCIAL HISTORY:  Patient lives with brother in split-level home with  multiple steps at entry.  She does not use any tobacco, drinks a glass a  wine q.h.s.   HOSPITAL COURSE:  [name] was admitted to rehab on May 30, 2003, for inpatient therapy to consist of PT/OT daily.  At time of  admission, she was noted to have UTI and was started on  Cipro x 7 days.  She  was also started on subcutaneous Lovenox as Coumadin on subtherapeutic  basis.  Labs done past admission showed postop anemia to be stable with  hemoglobin 10.2, hematocrit 28.8.  Her Coumadin has been followed with  routine pro time checks and has been very variable.  On day of discharge,  INR is noted to be trending downwards again at 1.4, and patient is  discharged on 6 mg Coumadin a day with next pro time on February 24.  The  patient's hip incision has healed well without any signs or symptoms of  infection, no drainage, no erythema noted. She is noted to have some edema  at the hip and 2+ pedal edema.  Pain has been well controlled.   During her stay in rehab, [name] has progressed along well.  She is  currently at modified  independent level for transfers, modified independent  for ambulating 100 to 200 feet. She is modified independent for upper body  care, requires some set up for showering.  She requires minimum assistance  to don her shoes.  Further followup therapy to include home health PT, OT by  [facility] with home health RN to arrange pro times.  On  June 06, 2003, the patient is discharged to home.   DISCHARGE MEDICATIONS:  1. Coumadin 6 mg p.o. q.p.m.  2. Trinsicon 1 p.o. b.i.d.  3. Zetia 10 mg.  4. Lopressor 25 mg b.i.d.  5. Os-Cal plus D t.i.d.  6. Oxycodone (IR) 10 mg q.4-6h. p.r.n. pain.  7. Senokot S 2 p.o. q.h.s.   ACTIVITY:  Use walker, follow right total hip precautions.   WOUND CARE:  Keep area clean and dry.   SPECIAL INSTRUCTIONS:  No alcohol, no smoking, no driving.  [facility] to provide PT, OT, and RN.  No use of aspirin or Plavix to June 23, 2003.   FOLLOW UP:  The patient is to follow up with Dr. Rennis Chris for postop check.  Follow up with Dr. Posey Rea for routine check in four weeks. Follow up with  Dr. Wynn Banker as needed.      Greg Cutter, P.A.                    Erick Colace, M.D.    PP/MEDQ  D:  06/06/2003  T:  06/07/2003  Job:  [id number]   cc:   Georgina Quint. Plotnikov, M.D. [facility]   Vania Rea. Supple, M.D.  [facility]  [address]  [address]  [place]  [place] [address]  Fax: [phone]

## 2010-08-30 NOTE — Assessment & Plan Note (Signed)
[facility] HEALTHCARE                              CARDIOLOGY OFFICE NOTE   BALEY, SHANDS                      MRN:          [id number]  DATE:10/21/2005                            DOB:          10-Dec-1920    PRIMARY CARE PHYSICIAN:  Sonda Primes, MD.   REASON FOR PRESENTATION:  Evaluate patient with atrial fibrillation.   HISTORY OF PRESENT ILLNESS:  Patient returns for follow-up.  At the last  visit, she was in atrial fibrillation and treated with digoxin and  anticoagulation.  She has had a fall since I last saw her.  She did not have  any loss of consciousness.  She tripped over sliding glass door frame.  She  fell and hit her head.  She went to [employer] and  apparently had negative work-up.  She has extensive bruising over her face  and leg.  She has had her Coumadin followed closely since that time.  Her  labs were okay in the emergency room.  She denies any palpitations,  presyncope or syncope.  She has had no shortness of breath and thinks that  it is improved compared to the in the office previously when her saturation  was 89%.  She has had no PND or orthopnea.   PAST MEDICAL HISTORY:  1.  Coronary artery disease (EF 69%. She had stenting of a vein graft with a      Taxus stent in the past.  She is status post CABG with an SVG to the LAD      and SVG to the circumflex).  2.  Hypertension.  3.  Hyperlipidemia.  4.  Hemorrhoidectomy.  5.  Hysterectomy.  6.  Osteoporosis.  7.  Atrial fibrillation.   ALLERGIES:  NO KNOWN DRUG ALLERGIES.   MEDICATIONS:  1.  Multivitamins.  2.  Aspirin 81 mg a day.  3.  Crestor 20 mg daily.  4.  Vitamin B complex.  5.  Fish oil.  6.  Coumadin per Coumadin clinic.  7.  Lopressor 75 mg b.i.d.  8.  Digitek 0.125 mg daily.  9.  Vitamin C.   REVIEW OF SYSTEMS:  As stated in the HPI and otherwise negative for other  systems.   PHYSICAL EXAMINATION:  GENERAL APPEARANCE:  The  patient is in no distress.  VITAL SIGNS:  Blood pressure 124/68, heart rate 45 and regular, weight 168  pounds.  HEENT:  Eye lids with extensive ecchymosis and a bruise over her forehead.  Pupils are equal, round and reactive to light.  Fundi not visualized.  Oral  mucosa unremarkable.  NECK:  No jugular venous distension at 45 degrees.  Carotid upstroke brisk  and symmetric, no bruits, no thyromegaly.  LYMPHATICS:  No cervical, axillary or inguinal adenopathy.  LUNGS:  Clear to auscultation bilaterally.  BACK:  No costovertebral angle tenderness.  CHEST:  Unremarkable.  CARDIOVASCULAR:  PMI not displaced or sustained.  S1 and S2 within normal  limits.  No S3, no S4, no murmurs.  ABDOMEN:  Flat, positive bowel sounds, normal in frequency and pitch,  no  bruits, no rebound, no guarding, no midline pulsatile mass, no organomegaly.  SKIN:  No rashes, no nodules.  EXTREMITIES:  There are 2+ pulses, no edema.  NEUROLOGIC:  Oriented to person, place and time.  Cranial nerves II-XII  grossly intact.  Motor grossly intact.   EKG with sinus bradycardia, rate 45, axis within normal limits, intervals  within normal limits, no acute STT wave changes.   ASSESSMENT/PLAN:  1.  Coronary disease.  The patient is having no ongoing symptoms.  No      further cardiovascular testing is suggested.  2.  Atrial fibrillation.  She is back in sinus rhythm.  She will maintain      her Coumadin.  I am going to decrease her Lopressor to 50 mg b.i.d. and      may have to go further if she remains bradycardic.  Again, I do not      think she is having any symptoms related to this.  3.  Dyslipidemia, per Dr. Posey Rea.  The goal is an LDL less than 100 and      HDL greater than 50.  4.  Follow-up:  I will see her back in three months or sooner if she has any      problems.                               Rollene Rotunda, MD, [facility]    JH/MedQ  DD:  10/21/2005  DT:  10/21/2005  Job #:  [id number]   cc:   Sonda Primes, MD

## 2010-08-30 NOTE — Assessment & Plan Note (Signed)
[facility]                             PRIMARY CARE OFFICE NOTE   TONGA, PROUT                      MRN:          [id number]  DATE:12/01/2005                            DOB:          1920/10/01    PROCEDURE:  Hematoma right forehead, there is no drainage.   INDICATIONS FOR PROCEDURE:  Hematoma unchanged in size for two months,  uncomfortable to the patient.  Risks including unsuccessful procedure,  bleeding, infection, scar formation, skin deformity as well as benefits  explained to the patient in detail.  She understands the high risk of  bleeding on Coumadin.   She was placed in decubitus position.  The hematoma measuring 2 cm raised up  to 1 cm.  __________ on the right side of the forehead was prepped with  Betadine and alcohol and injected in the right upper quadrant with 0.5 mL of  2% lidocaine with epinephrine.  An 18-gauge needle was introduced in the  cavity.  I tried to aspirate unsuccessfully.  The needle was removed.  A 0.5  mm incision with a straight blade was performed into the cavity of the  hematoma.  Curette and pressure was used to remove large amount of dark  purple/black clots.  Tolerated well.   COMPLICATIONS:  None.   The wound was covered with band-aid and antibiotic ointment.  Wound  instructions provided.  She will use a mild heating pad.  I felt the need to  cover her with antibiotics and prescribed Keflex 500 two twice a day for 10  days.  I will see her back in a couple of weeks for follow-up.  She will  call me if there are any problems.                                   Sonda Primes, MD   AP/MedQ  DD:  12/01/2005  DT:  12/01/2005  Job #:  [id number]

## 2010-08-30 NOTE — H&P (Signed)
NAMEROYANN, WILDASIN               ACCOUNT NO.:  [id number]   MEDICAL RECORD NO.:  [id number]          PATIENT TYPE:  INP   LOCATION:  1824                         FACILITY:  [facility]   PHYSICIAN:  Arturo Morton. Riley Kill, M.D. [facility] OF BIRTH:  [birth year]   DATE OF ADMISSION:  07/18/2004  DATE OF DISCHARGE:                                HISTORY & PHYSICAL   SUMMARY OF HISTORY:  Ms. Frier is an 77 year old white female who was  referred to the emergency room this morning for chest discomfort.  She  stated last evening at approximately 8 o'clock while sitting and talking she  developed anterior chest pressure associated with sensation of a fast and  irregular heartbeat.  This radiated into both her arms and was associated  with shortness of breath and diaphoresis.  She gave it a 3 on a scale of 0-  10.  She states that it did not resemble her prior symptoms related to her  bypass or stenting.  Her night was very restless with some anxiety.  Her  symptoms persisted all night and throughout the morning.  This morning after  she breakfasted and showered she called the office and she was initially  going to be seen in the afternoon in the office.  However, then later  referred here.  In the emergency room, she received a 20 mg bolus of  Cardizem for atrial fibrillation with rapid ventricular rate.  Her rate  slowed to the 50s and 60s which relieved her discomfort, but the  irregularity persisted.  She denies prior occurrences.   PAST MEDICAL HISTORY:  No known drug allergies.   MEDICATIONS PRIOR TO ADMISSION:  1.  Lopressor 50 mg half tablet b.i.d.  2.  Lotrel 5/10 mg daily.  3.  Crestor 20 mg daily.  4.  Fosamax 70 mg weekly.  5.  Aspirin 325 mg daily.  6.  Multivitamin daily.  7.  Fish oil two daily.  8.  Super TS/vitamin C and E daily.  9.  Nitroglycerin 0.4 p.r.n. which is old.   Her history is notable for hypertension, hyperlipidemia, osteoporosis.  She  underwent three-vessel  bypass surgery in 1983 with saphenous vein graft to  the LAD and diagonal one and a saphenous vein graft to circumflex.  Dr.  Katrinka Blazing performed percutaneous intervention on the circumflex in 1991.  She  had a positive Cardiolite in August 2004 that showed an EF of 69%,  anteroseptal ischemia.  She underwent cardiac catheterization on December 12, 2002.  EF was 75%.  Please refer to dictated report.  She underwent stenting  with a Taxus stent to the LAD.  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  History is also notable for hysterectomy,  hemorrhoidectomy, and status post right hip fracture repair in 2005.   SOCIAL HISTORY:  She resides in [place] with her brother.  She is a  retired [employer] operational officer.  She is widowed with three step children.  She has  never smoked.  She drinks a glass of wine every evening.  Denies any drugs,  herbal medications,  or specific diet.  She has been going through rehab in  regards to her hip surgery.   FAMILY HISTORY:  Notable for the death of her mother at the age of 72 with  heart problems.  Her father at age of 52 with lung cancer.  She has two  sisters and three brothers.   REVIEW OF SYSTEMS:  Notable for weight gain over the last year.  However,  she is not sure the amount, just describes her clothes being too tight.  Glasses.  Chronic dyspnea on exertion, nocturia, menopause, arthralgias all  over, gastroesophageal reflux disease symptoms in addition to above.   PHYSICAL EXAMINATION:  GENERAL:  Well-developed, well-nourished, pleasant  white female in no apparent distress.  VITAL SIGNS:  Temperature 97.5, pulse 129, respirations 23, saturations 97%  on room air.  Blood pressure was not obtained on arrival.  Post Cardizem  administration blood pressure was 92/58.  HEENT:  Normocephalic, atraumatic.  PERRLA.  EOMI.  Sclerae clear.  She is  wearing glasses.  Dentition is good.  NECK:  Supple without thyromegaly,  adenopathy, jugular venous distention, or  carotid bruits.  HEART:  Distant heart sounds, irregularly irregular.  At this time is not  rapid. S3, S4, murmurs, clicks, or gallops are not appreciated.  Pulses are  symmetrical and intact in all extremities without abdominal or femoral  bruits.  CHEST:  Symmetrical excursion.  Lung sounds are slightly diminished, but  clear to auscultation.  SKIN:  Integument was intact.  She does have a lesion on the left inner  thigh which she states will be followed up with her primary care physician.  ABDOMEN:  Obese, bowel sounds present without organomegaly, masses, or  tenderness.  EXTREMITIES:  Negative cyanosis, clubbing, or edema.  MUSCULOSKELETAL:  Unremarkable.  NEURO:  Alert and oriented x3.  Grossly intact.   As noted at this time, chest x-ray is pending.  H&H is 14.2 and 39.9,  platelets 243, WBC 7.9, normal indices.  PTT 29, PT 12.1, ER marker is  negative x1.  EKG on admission shows atrial fibrillation with ventricular  rate of 113, normal axis, nonspecific ST-T waves, early R.   IMPRESSION:  New onset atrial fibrillation with increased ventricular rate  of less than 24 hours duration.  Unstable angina is associated with the  above.  However, this resolved with rate control.  History per past medical  history.   DISPOSITION:  Dr. Riley Kill reviewed the patient's history and spoke with and  examined the patient and agrees with above.  We will admit the patient to  telemetry unit.  We will hold the Fosamax while she is here in the hospital.  As that her blood pressure is low, we will not start on IV Cardizem drip.  We will rule her out for myocardial infarction and check a 2-D echo as well  as thyroid function studies.  Hopefully, with her rate slowed she will  convert back to normal sinus rhythm and avoid long term anticoagulation.  We will resume her home medications except as noted above and give parameters  for the Lopressor.  We will  also start the patient on IV Heparin.      EW/MEDQ  D:  07/18/2004  T:  07/18/2004  Job:  [id number]   cc:   Georgina Quint. Plotnikov, M.D. [facility]   Rollene Rotunda, M.D.

## 2010-08-30 NOTE — Assessment & Plan Note (Signed)
[facility]                              CARDIOLOGY OFFICE NOTE   [name], [name]                      MRN:          [id number]  DATE:01/22/2006                            DOB:          November 28, 1920    PRIMARY:  Dr. Posey Rea.   REASON FOR PRESENTATION:  Evaluate patient with atrial fibrillation and  coronary disease.   HISTORY OF PRESENT ILLNESS:  The patient returns for followup.  She is doing  well since I last saw her.  I reduced her beta blocker because she was  somewhat bradycardic.  She does not notice her heart rhythm.  She has not  felt any different with the reduced dose of beta blocker, although her heart  rate is now 60 instead of 40s.  She denies any chest pain.  She has had no  palpitations, no bruits, syncope, or presyncope.  She has had no PND or  orthopnea.   PAST MEDICAL HISTORY:  1. Coronary artery disease (EF 69%.  She had stenting of the vein graft      with a TAXUS stent in the past.  She is status post CABG with a SVG to      the LAD and a SVG to circumflex).  2. Hypertension.  3. Dyslipidemia.  4. Hemorrhoidectomy.  5. Hysterectomy.  6. Osteoporosis.  7. Atrial fibrillation.   ALLERGIES:  None.   CURRENT MEDICATIONS:  1. Multivitamin.  2. Aspirin 81 mg a day.  3. Fosamax 70 mg a week.  4. Crestor 20 mg daily.  5. Vitamin B.  6. Fish oil.  7. Calcium.  8. Coumadin.  9. Lopressor 50 mg b.i.d.  10.Digitek 0.125 mg a day.  11.Vitamin C.   REVIEW OF SYSTEMS:  As stated in the HPI, otherwise negative for other  systems.   PHYSICAL EXAMINATION:  The patient is in no distress.  Blood pressure 140/80, heart rate 63 and regular.  Weight 165 pounds, body  mass index 27.  NECK:  No jugular venous distention at 45 degrees, carotid upstroke brisk  and symmetric, no bruits, no thyromegaly.  LYMPHATICS:  No adenopathy.  LUNGS:  Clear to auscultation bilaterally.  BACK:  No costovertebral angle tenderness.  CHEST:  Unremarkable.  HEART:  PMI not displaced or sustained, S1 and S2 within normal limits, no  S3, no S4, no murmurs.  ABDOMEN:  Flat, positive bowel sounds.  Normal in frequency and pitch, no  bruits, no rebound, no guarding, no midline pulsatile mass, no organomegaly.  SKIN:  No rashes, no nodules.  EXTREMITIES:  2+ pulses, no edema.   EKG sinus rhythm, rate 63, axis within normal limits, intervals within  normal limits, no acute ST-T wave changes.   ASSESSMENT AND PLAN:  1. Atrial fibrillation.  She has not had symptomatic paroxysm of this.      She is tolerating the Coumadin.  No further testing is warranted.  2. Coronary artery disease.  She is having no symptoms.  No further      cardiovascular testing is suggested.  She will continue with secondary  risk reduction.  3. Followup.  I will see her back in 6 months or sooner if needed.            ______________________________  Rollene Rotunda, MD, [facility]     JH/MedQ  DD:  01/22/2006  DT:  01/23/2006  Job #:  [id number]   cc:   Georgina Quint. Plotnikov, MD

## 2010-08-30 NOTE — Op Note (Signed)
NAMETAMEY, [name]                           ACCOUNT NO.:  [id number]   MEDICAL RECORD NO.:  [id number]                   PATIENT TYPE:  EMS   LOCATION:  ED                                   FACILITY:  [facility]   PHYSICIAN:  Vania Rea. Supple, M.D.               DATE OF BIRTH:  Oct 25, 1920   DATE OF PROCEDURE:  05/26/2003  DATE OF DISCHARGE:                                 OPERATIVE REPORT   PREOPERATIVE DIAGNOSES:  Displaced right femoral neck fracture.   POSTOPERATIVE DIAGNOSES:  Displaced right femoral neck fracture.   PROCEDURE:  Cemented right hip unipolar hemiarthroplasty utilizing an  Osteonics HFF size 7 stem 127 degree neck angle, plus zero neck length, 48  mm head.   SURGEON:  Vania Rea. Supple, M.D.   Threasa HeadsFrench Ana A. [name], P.A.-C.   ANESTHESIA:  General endotracheal.   ESTIMATED BLOOD LOSS:  750 mL   DRAINS:  None.   HISTORY:  [name] is an 77 year old female who has been living  independently and apparently slipped and fell at a local eating  establishment with immediate complaints of right hip pain and inability to  bear weight.  She is brought to the [facility] Emergency Room where on  examination she was found to have a foreshortened and externally rotated  right lower extremity with extreme discomfort on attempts at right hip  motion. Radiographs confirmed a displaced right femoral neck fracture. She  is brought to the operating room at this time for planned right hip  hemiarthroplasty.  Preoperatively I discussed with [name] as well as  her family members, treatment options as well as risks versus benefits  thereof.  Possible surgical complications bleeding, infection, neurovascular  injury, DVT, PE, leg length discrepancy, potential for dislocation and  possible need for revision surgery and anesthetic complications were all  reviewed.  She understands and accepts and agrees with our planned  procedure.   DESCRIPTION OF PROCEDURE:  After  undergoing routine preop evaluation, the  patient received prophylactic antibiotics. She was brought to the operating  room on her hospital bed and underwent smooth induction of general  endotracheal anesthesia.  She was then transferred to the operating table  and placed into the left lateral decubitus position  and appropriately  padded and protected. The right hip girdle region was sterilely prepped and  draped in the standard fashion. We made a posterior approach to the right  hip through a 15 cm incision centered over the greater trochanter curving  posteriorly.  The skin was divided sharply and electrocautery was used for  hemostasis. The deep fascia was then divided along the lines with the skin  incision with gluteus maximus split proximally along the line of its fibers.  Self retaining retractors were then placed.  The gluteus medius and minimis  were reflected anteriorly, piriformis and short external rotators were  reflected posteriorly and then the  capsule was teased and reflected  superiorly and posteriorly.  The fracture site was then exposed and the  femoral head was then removed with a cork screw and ice cream scoop. We  removed the residual ligament of Teres and debrided from the acetabulum.  In  addition, there was a relatively large posterior superior osteophyte which  was removed from the acetabular rim.  The femoral head was then measured at  approximately 47 mm. We trialed a 46, 47 and 48 and 48 had the best fit  within the acetabulum.  Attention was then returned to the proximal femur  which was elevated through the wound with a Mueller retractor.  The guide  pin was then directed from the piriformis fossa down into the intramedullary  canal followed by a step drill. The canal finder was then passed.  Sequential reamings were then performed up to a size 7 and then a neck cut  was then made with an oscillating saw one fingerbreadth proximal to the  lesser trochanter.   Sequential broachings were then performed up to a size 7  with good canal fill and fit.  A trial reduction was performed off the trial  broach and clinically equal leg lengths were achieved.  Good stability was  found through full hip motion. The trial was then dislocated. The broach was  removed. A distal cement plug was then placed at the appropriate depth and  the canal was the pulsatilely lavaged and meticulously cleaned.  The cement  was then mixed at the back table and at the appropriate consistency.  It was  introduced into the canal in a retrograde fashion. Cement pressurize was  then used. The femoral stem was then impacted into position and all extra  cement was meticulously removed. We performed another reduction off of the  final stem once the cement had appropriately hardened. We did meticulously  clean the acetabulum. We used a plus zero neck length and this again showed  good soft tissue balance and clinically equal leg lengths. This was then  dislocated. The final implants were opened. The trunnion was meticulously  cleaned.  We placed the femoral head and neck with the plus zero neck  length. Final reduction was performed.  The hip taken through a range of  motion showing good stability.  The capsule was reapproximated and short  external rotators were then closed to the greater trochanter with #1  Ethibond suture. The #1 Vicryl was used to close the deep fascia, 2-0 Vicryl  was used for deep and superficial subcu and intracuticular 3-0 Monocryl was  used for the skin followed by Steri-Strips. A bulky dry dressing was then  taped over the right hip. The patient was then placed supine, knee  immobilizer was then applied, extubated and taken to the recovery room in  stable condition.                                               Vania Rea. Supple, M.D.    KMS/MEDQ  D:  05/26/2003  T:  05/26/2003  Job:  [id number]

## 2010-08-30 NOTE — Discharge Summary (Signed)
[name], [name]               ACCOUNT NO.:  [id number]   MEDICAL RECORD NO.:  [id number]          PATIENT TYPE:  INP   LOCATION:  3733                         FACILITY:  [facility]   PHYSICIAN:  Arturo Morton. Riley Kill, M.D. [facility] OF BIRTH:  Oct 10, 1920   DATE OF ADMISSION:  07/18/2004  DATE OF DISCHARGE:  07/19/2004                                 DISCHARGE SUMMARY   PROCEDURES:  Two-D echocardiogram.   DISCHARGE DIAGNOSES:  1.  Chest pain, cardiac enzymes negative for myocardial infarction on      Cardiolite on Monday.  2.  Hypertension.  3.  Hyperlipidemia.  4.  Osteoporosis.  5.  Status post aortocoronary bypass surgery in 1983 with SVG to LAD and      PL1, SVG to circumflex  6.  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  7.  Status post hysterectomy, hernia repair, and right hip surgery, as well      as bilateral cataracts.  8.  Arthritis.   HOSPITAL COURSE:  [name] is an 77 year old female with a history of  coronary artery disease. She had chest pain and came to the hospital where  she was admitted for further evaluation and treatment.   Her cardiac enzymes were negative for MI. She was seen by Dr. Antoine Poche who  felt that evaluation by outpatient Cardiolite was adequate. She is scheduled  for this on Monday.   She came in in atrial fibrillation with rapid ventricular response. She  converted to sinus rhythm overnight. Dr. Antoine Poche evaluated her and felt  that he was unable to change her medications because of bradycardia. She was  started on Coumadin. She is to get a follow-up Cardiolite in the office and  be followed there for her Coumadin as well. [name] is considered stable  for discharge on 07/19/2004 with outpatient followup arranged.   DISCHARGE INSTRUCTIONS:  1.  Her activity level is to include no strenuous activity.  2.  She is to stick to a low-fat diet.  3.  She is  to follow up in the office on Monday at 10:00 a.m. for a      Cardiolite.  4.  She is to follow up with the [facility]madin Clinic as well.   DISCHARGE MEDICATIONS:  1.  Coumadin 5 mg q.d. or as directed.  2.  Lopressor 50 mg, 1/2 tablet b.i.d.  3.  Lotrel 5/10 mg daily.  4.  Crestor 20 mg daily  5.  Fosamax weekly.  6.  Aspirin 325 mg daily, decreased to 81 mg daily in 5 days.  7.  Multivitamin, fish oil, vitamin C, and vitamin E daily.      RB/MEDQ  D:  07/19/2004  T:  07/20/2004  Job:  [id number]   cc:   Georgina Quint. Plotnikov, M.D. [facility]   Rollene Rotunda, M.D.

## 2010-09-03 ENCOUNTER — Ambulatory Visit (INDEPENDENT_AMBULATORY_CARE_PROVIDER_SITE_OTHER): Payer: Medicare Other | Admitting: *Deleted

## 2010-09-03 DIAGNOSIS — I4891 Unspecified atrial fibrillation: Secondary | ICD-10-CM

## 2010-09-10 ENCOUNTER — Other Ambulatory Visit: Payer: Self-pay | Admitting: Internal Medicine

## 2010-09-10 DIAGNOSIS — Z1231 Encounter for screening mammogram for malignant neoplasm of breast: Secondary | ICD-10-CM

## 2010-09-16 ENCOUNTER — Ambulatory Visit
Admission: RE | Admit: 2010-09-16 | Discharge: 2010-09-16 | Disposition: A | Discharge status: Home / Self Care | Payer: Medicare Other | Source: Ambulatory Visit | Attending: Internal Medicine | Admitting: Internal Medicine

## 2010-09-16 ENCOUNTER — Other Ambulatory Visit (INDEPENDENT_AMBULATORY_CARE_PROVIDER_SITE_OTHER): Payer: Medicare Other

## 2010-09-16 DIAGNOSIS — R7309 Other abnormal glucose: Secondary | ICD-10-CM

## 2010-09-16 DIAGNOSIS — Z1231 Encounter for screening mammogram for malignant neoplasm of breast: Secondary | ICD-10-CM

## 2010-09-16 DIAGNOSIS — I10 Essential (primary) hypertension: Secondary | ICD-10-CM

## 2010-09-16 LAB — CBC WITH DIFFERENTIAL/PLATELET
Eosinophils Relative: 2.1 % (ref 0.0–5.0)
Lymphocytes Relative: 24.5 % (ref 12.0–46.0)
Monocytes Absolute: 0.5 10*3/uL (ref 0.1–1.0)
Monocytes Relative: 7.5 % (ref 3.0–12.0)
Neutrophils Relative %: 65.4 % (ref 43.0–77.0)
Platelets: 206 10*3/uL (ref 150.0–400.0)
WBC: 6.4 10*3/uL (ref 4.5–10.5)

## 2010-09-16 LAB — BASIC METABOLIC PANEL
Chloride: 105 mEq/L (ref 96–112)
GFR: 49.29 mL/min — ABNORMAL LOW (ref >60.00)
Glucose, Bld: 111 mg/dL — ABNORMAL HIGH (ref 70–99)
Potassium: 4.7 mEq/L (ref 3.5–5.1)
Sodium: 140 mEq/L (ref 135–145)

## 2010-09-16 LAB — HEPATIC FUNCTION PANEL
AST: 35 U/L (ref 0–37)
Alkaline Phosphatase: 49 U/L (ref 39–117)
Total Bilirubin: 1.4 mg/dL — ABNORMAL HIGH (ref 0.3–1.2)

## 2010-09-16 LAB — LIPID PANEL
LDL Cholesterol: 70 mg/dL (ref 0–99)
VLDL: 39 mg/dL (ref 0.0–40.0)

## 2010-09-16 LAB — HEMOGLOBIN A1C: Hgb A1c MFr Bld: 6.6 % — ABNORMAL HIGH (ref 4.6–6.5)

## 2010-09-17 ENCOUNTER — Other Ambulatory Visit: Payer: Self-pay | Admitting: Internal Medicine

## 2010-09-17 DIAGNOSIS — R7309 Other abnormal glucose: Secondary | ICD-10-CM

## 2010-09-17 DIAGNOSIS — I10 Essential (primary) hypertension: Secondary | ICD-10-CM

## 2010-09-24 ENCOUNTER — Ambulatory Visit (INDEPENDENT_AMBULATORY_CARE_PROVIDER_SITE_OTHER): Payer: Medicare Other | Admitting: Internal Medicine

## 2010-09-24 ENCOUNTER — Encounter: Payer: Self-pay | Admitting: Internal Medicine

## 2010-09-24 DIAGNOSIS — E785 Hyperlipidemia, unspecified: Secondary | ICD-10-CM

## 2010-09-24 DIAGNOSIS — I509 Heart failure, unspecified: Secondary | ICD-10-CM

## 2010-09-24 DIAGNOSIS — E039 Hypothyroidism, unspecified: Secondary | ICD-10-CM

## 2010-09-24 DIAGNOSIS — M810 Age-related osteoporosis without current pathological fracture: Secondary | ICD-10-CM

## 2010-09-24 DIAGNOSIS — I4891 Unspecified atrial fibrillation: Secondary | ICD-10-CM

## 2010-09-24 DIAGNOSIS — E119 Type 2 diabetes mellitus without complications: Secondary | ICD-10-CM

## 2010-09-24 DIAGNOSIS — I2510 Atherosclerotic heart disease of native coronary artery without angina pectoris: Secondary | ICD-10-CM

## 2010-09-24 DIAGNOSIS — R202 Paresthesia of skin: Secondary | ICD-10-CM

## 2010-09-24 DIAGNOSIS — Z0000 Encounter for general adult medical examination without abnormal findings: Secondary | ICD-10-CM

## 2010-09-24 DIAGNOSIS — R209 Unspecified disturbances of skin sensation: Secondary | ICD-10-CM

## 2010-09-24 NOTE — Assessment & Plan Note (Signed)
On Rx 

## 2010-09-24 NOTE — Assessment & Plan Note (Signed)
On Crestor 

## 2010-09-24 NOTE — Assessment & Plan Note (Signed)
Cont Rx 

## 2010-09-24 NOTE — Progress Notes (Signed)
  Subjective:    Patient ID: [name], female    DOB: 1921/01/12, 77 y.o.   MRN: [id number]  HPI   The patient presents for a follow-up of  chronic hypertension, chronic dyslipidemia, CAD, A fib, type 2 diabetes controlled with medicines   Review of Systems  Constitutional: Negative.  Negative for fever, chills, diaphoresis, activity change, appetite change, fatigue and unexpected weight change.  HENT: Negative for hearing loss, ear pain, nosebleeds, congestion, sore throat, facial swelling, rhinorrhea, sneezing, mouth sores, trouble swallowing, neck pain, neck stiffness, postnasal drip, sinus pressure and tinnitus.   Eyes: Negative for pain, discharge, redness, itching and visual disturbance.  Respiratory: Negative for cough, chest tightness, shortness of breath, wheezing and stridor.   Cardiovascular: Negative for chest pain, palpitations and leg swelling.  Gastrointestinal: Negative for nausea, diarrhea, constipation, blood in stool, abdominal distention, anal bleeding and rectal pain.  Genitourinary: Negative for dysuria, urgency, frequency, hematuria, flank pain, vaginal bleeding, vaginal discharge, difficulty urinating, genital sores and pelvic pain.  Musculoskeletal: Positive for gait problem. Negative for back pain, joint swelling and arthralgias.  Skin: Negative.  Negative for rash.  Neurological: Negative for dizziness, tremors, seizures, syncope, speech difficulty, weakness, numbness and headaches.  Hematological: Negative for adenopathy. Does not bruise/bleed easily.  Psychiatric/Behavioral: Negative for suicidal ideas, behavioral problems, sleep disturbance, dysphoric mood and decreased concentration. The patient is not nervous/anxious.        Objective:   Physical Exam  Constitutional: She appears well-developed and well-nourished. No distress.  HENT:  Head: Normocephalic.  Right Ear: External ear normal.  Left Ear: External ear normal.  Nose: Nose normal.    Mouth/Throat: Oropharynx is clear and moist.  Eyes: Conjunctivae are normal. Pupils are equal, round, and reactive to light. Right eye exhibits no discharge. Left eye exhibits no discharge.  Neck: Normal range of motion. Neck supple. No JVD present. No tracheal deviation present. No thyromegaly present.  Cardiovascular: Normal rate, regular rhythm and normal heart sounds.   Pulmonary/Chest: No stridor. No respiratory distress. She has no wheezes.  Abdominal: Soft. Bowel sounds are normal. She exhibits no distension and no mass. There is no tenderness. There is no rebound and no guarding.  Musculoskeletal: She exhibits no edema and no tenderness.       Slow walking  Lymphadenopathy:    She has no cervical adenopathy.  Neurological: She displays normal reflexes. No cranial nerve deficit. She exhibits normal muscle tone. Coordination (mild) abnormal.  Skin: No rash noted. No erythema.  Psychiatric: She has a normal mood and affect. Her behavior is normal. Judgment and thought content normal.        Lab Results  Component Value Date   WBC 6.4 09/16/2010   HGB 15.0 09/16/2010   HCT 43.3 09/16/2010   PLT 206.0 09/16/2010   CHOL 165 09/16/2010   TRIG 195.0* 09/16/2010   HDL 55.60 09/16/2010   LDLDIRECT 100.5 09/18/2009   ALT 22 09/16/2010   AST 35 09/16/2010   NA 140 09/16/2010   K 4.7 09/16/2010   CL 105 09/16/2010   CREATININE 1.1 09/16/2010   BUN 23 09/16/2010   CO2 27 09/16/2010   TSH 1.74 03/05/2010   INR 2.2 09/03/2010   HGBA1C 6.6* 09/16/2010   Wt Readings from Last 3 Encounters:  09/24/10 158 lb (71.668 kg)  03/19/10 153 lb (69.4 kg)  03/06/10 155 lb 4 oz (70.421 kg)     Assessment & Plan:

## 2010-09-24 NOTE — Assessment & Plan Note (Signed)
Will watch 

## 2010-09-28 ENCOUNTER — Other Ambulatory Visit: Payer: Self-pay | Admitting: Internal Medicine

## 2010-10-01 ENCOUNTER — Ambulatory Visit (INDEPENDENT_AMBULATORY_CARE_PROVIDER_SITE_OTHER): Payer: Medicare Other | Admitting: *Deleted

## 2010-10-01 DIAGNOSIS — I4891 Unspecified atrial fibrillation: Secondary | ICD-10-CM

## 2010-10-01 LAB — POCT INR: INR: 2.4

## 2010-10-13 ENCOUNTER — Other Ambulatory Visit: Payer: Self-pay | Admitting: Internal Medicine

## 2010-10-24 ENCOUNTER — Telehealth: Payer: Self-pay

## 2010-10-24 DIAGNOSIS — K649 Unspecified hemorrhoids: Secondary | ICD-10-CM

## 2010-10-24 NOTE — Telephone Encounter (Signed)
Patient called requesting referral or MD suggestion for an MD that specialize in hemorrhoids. Please advise. She would also like advisement on how to stop coumadin prior to surgery if its needed

## 2010-10-25 NOTE — Telephone Encounter (Signed)
Dr Purnell Shoemaker (done). Coum will be addressed if surg is planned Thx

## 2010-10-28 NOTE — Telephone Encounter (Signed)
Patient informed per MD

## 2010-10-29 ENCOUNTER — Ambulatory Visit (INDEPENDENT_AMBULATORY_CARE_PROVIDER_SITE_OTHER): Payer: Medicare Other | Admitting: *Deleted

## 2010-10-29 DIAGNOSIS — I4891 Unspecified atrial fibrillation: Secondary | ICD-10-CM

## 2010-10-29 LAB — POCT INR: INR: 2.2

## 2010-11-06 ENCOUNTER — Ambulatory Visit (INDEPENDENT_AMBULATORY_CARE_PROVIDER_SITE_OTHER): Payer: Medicare Other | Admitting: Surgery

## 2010-11-14 ENCOUNTER — Ambulatory Visit (INDEPENDENT_AMBULATORY_CARE_PROVIDER_SITE_OTHER): Payer: Medicare Other | Admitting: General Surgery

## 2010-11-23 ENCOUNTER — Other Ambulatory Visit: Payer: Self-pay | Admitting: Internal Medicine

## 2010-11-25 ENCOUNTER — Ambulatory Visit (INDEPENDENT_AMBULATORY_CARE_PROVIDER_SITE_OTHER): Payer: Medicare Other | Admitting: General Surgery

## 2010-11-25 ENCOUNTER — Encounter (INDEPENDENT_AMBULATORY_CARE_PROVIDER_SITE_OTHER): Payer: Self-pay | Admitting: General Surgery

## 2010-11-25 VITALS — BP 122/78 | HR 84 | Temp 97.2°F

## 2010-11-25 DIAGNOSIS — K648 Other hemorrhoids: Secondary | ICD-10-CM

## 2010-11-25 NOTE — Progress Notes (Signed)
[name] is a 77 y.o. female.    Chief Complaint  Patient presents with  . Other    hemorrhoids    HPI HPI She is referred by Dr. Posey Rea evaluation of bleeding internal hemorrhoids. The bleeding is intermittent and always associated with a bowel movement. Medical management has failed to control the bleeding. Of note is that she is on chronic Coumadin for chronic atrial fibrillation. Sometimes she has to strain to have a bowel movement. She is interested in having treatment for this. She is here with her sister who is 69.  Past Medical History  Diagnosis Date  . Anemia   . CHF (congestive heart failure)     Dr Antoine Poche  . CAD (coronary artery disease)     Ejection fraction 69%  . Type II or unspecified type diabetes mellitus without mention of complication, not stated as uncontrolled   . Hyperlipidemia   . HTN (hypertension)   . Osteoporosis   . Hip fracture, right   . LBP (low back pain)     Dr Prince Rome  . Warfarin anticoagulation   . Atrial fibrillation   . Hypothyroidism     Past Surgical History  Procedure Date  . Total hip arthroplasty     Right  . Cardiac surgery     triple bypass  . Abdominal hysterectomy   . Coronary angioplasty with stent placement     Family History  Problem Relation Age of Onset  . Hyperlipidemia Other   . Hypertension Other   . Heart disease Mother     arrhythmia  . Cancer Father     lung ca  . Heart disease Sister   . Heart disease Sister   . Heart disease Brother   . Heart disease Brother     Social History History  Substance Use Topics  . Smoking status: Never Smoker   . Smokeless tobacco: Not on file  . Alcohol Use: Yes    No Known Allergies  Current Outpatient Prescriptions  Medication Sig Dispense Refill  . b complex vitamins tablet Take 1 tablet by mouth daily.        . Cholecalciferol 1000 UNITS tablet Take 1,000 Units by mouth daily.        . diclofenac sodium (VOLTAREN) 1 % GEL Apply topically 2 (two)  times daily as needed.        . fish oil-omega-3 fatty acids 1000 MG capsule Take 1 g by mouth daily.        . furosemide (LASIX) 40 MG tablet Take 20-40 mg by mouth daily as needed.        Marland Kitchen KLOR-CON M20 20 MEQ tablet TAKE ONE TABLET BY MOUTH EVERY DAY  30 each  5  . levothyroxine (SYNTHROID, LEVOTHROID) 50 MCG tablet TAKE ONE TABLET BY MOUTH EVERY DAY FOR THYROID  30 tablet  2  . metoprolol (LOPRESSOR) 50 MG tablet Take 75 mg by mouth 2 (two) times daily.        . nitroGLYCERIN (NITROSTAT) 0.4 MG SL tablet Place 0.4 mg under the tongue every 5 (five) minutes as needed.        . rosuvastatin (CRESTOR) 20 MG tablet Take 20 mg by mouth daily.        . vitamin C (ASCORBIC ACID) 500 MG tablet Take 500 mg by mouth daily.        Marland Kitchen warfarin (COUMADIN) 5 MG tablet TAKE AS DIRECTED  45 tablet  1    Review of Systems  ROS General:  _X_Normal __Fever__Weight change __Night sweats __Fatigue __Sleep loss  Breast:  _X_Normal __Lump __Pain __Nipple discharge __Infection __Other  Infectious Diseases:  _X_Normal __HIV/AIDS __Tuberculosis __Hepatitis A       __ Hepatitis B __Hepatitis C __ STD __MRSA(staph infection) __Other  Dental:  _X_Normal __Dentures __Other  Cardiac  :__ Normal __Pacemaker __Defibrillator _X_Bypass surgery __High blood pressure __ Peripheral vascular disease __Heart attack _X_Irregular heart beat __Valve       disease  Pulmonary:  __Normal __Cough/Sputum __Bronchitis __Asthma __COPD                    _X_Short of breath __Pneumonia __Sleep Apnea  Endocrine:  __Normal __Diabetes __Thyroid disease _X_High Cholesterol __Other  Skin:  __Normal  __Rash _X_Bruise easily __Cancer __Abnormal moles __Other  Gastrointestinal:  _X_Normal __Nausea/Vomiting/Diarrhea __Colon polyp or Cancer       __Irritable Bowel disease __Poor appetite __Hiatal hernia or reflux __Ulcer       __Liver disease __Abdominal pain __Hernia __Rectal bleeding or hemorrhoids       __Other  Genitourinary:   _X_Normal __Kidney disease or stones __Prostate problems        __Blood in urine __Difficulty voiding __Incontinence/leakage __Other  Neurological:  _X_Normal __Stroke __Paralysis __Seizure __Alzheimers disease       __Headaches __Dizziness __Fainting __Weakness __Other  Hematologic/Lymphatic:  _X_Normal __Bleeding disorder __Blood clots __Anemia       __Swollen lymph nodes __Blood Transfusion __Other  Immune System:  _X_Normal __Previous/Current Cancer-type:  __Other  HEENT:  __Normal _X_Hearing loss  __Hearing aid __Ear infection __Nose bleed       __Hoarseness __Sore throat __Blurred or double vision _X_Glasses or contacts       __Glaucoma __Retinopathy __Macular degeneration __Other  Musculoskeletal:  _X_Normal __Joint pain __Arthritis __Other        Physical Exam Physical Exam  Constitutional:       Elderly female in no acute distress. Pleasant and cooperative.  Cardiovascular:  No murmur heard.      Irregular rate, irregular rhythm.  Genitourinary:       No external lesions or fissures. No masses on digital rectal exam.  Anoscopy-moderate size inflamed right posterior hemorrhoid. Small right anterior and left lateral hemorrhoids.  Psychiatric: She has a normal mood and affect. Judgment normal.     Blood pressure 122/78, pulse 84, temperature 97.2 F (36.2 C).  Assessment/Plan Bleeding internal hemorrhoid-most likely right posterior hemorrhoid. On chronic anticoagulation therapy. I spoke with Dr. Posey Rea and he felt it would be okay to stop her Coumadin 5 days before the procedure.  Plan: We'll schedule her to come back for office rubber band ligation. He understands the complication of bleeding. We also discussed the fact that it may take more than one treatment.  Lavine Hargrove J 11/25/2010, 2:14 PM

## 2010-11-25 NOTE — Patient Instructions (Signed)
Stop Coumadin 5 days before the appointment please.

## 2010-11-26 ENCOUNTER — Ambulatory Visit (INDEPENDENT_AMBULATORY_CARE_PROVIDER_SITE_OTHER): Payer: Medicare Other | Admitting: *Deleted

## 2010-11-26 DIAGNOSIS — I4891 Unspecified atrial fibrillation: Secondary | ICD-10-CM

## 2010-11-26 LAB — POCT INR: INR: 2.3

## 2010-11-28 ENCOUNTER — Telehealth (INDEPENDENT_AMBULATORY_CARE_PROVIDER_SITE_OTHER): Payer: Self-pay | Admitting: General Surgery

## 2010-11-28 ENCOUNTER — Telehealth (INDEPENDENT_AMBULATORY_CARE_PROVIDER_SITE_OTHER): Payer: Self-pay

## 2010-11-28 NOTE — Telephone Encounter (Signed)
Mrs [name] was put in my VM reg her up coming surgery and she stated that she went to her coumadin nurse and the nurse was asking her why did Dr Abbey Chatters did call their office for clearance for her up coming surgery pt wanted you to call back at [phone]

## 2010-11-28 NOTE — Telephone Encounter (Signed)
Spoke with pt to clarify that Dr. Posey Rea had ok'd stopping her Coumadin 5 days prior to her office surgery.

## 2010-12-11 ENCOUNTER — Encounter (INDEPENDENT_AMBULATORY_CARE_PROVIDER_SITE_OTHER): Payer: Self-pay | Admitting: General Surgery

## 2010-12-11 ENCOUNTER — Ambulatory Visit (INDEPENDENT_AMBULATORY_CARE_PROVIDER_SITE_OTHER): Payer: Medicare Other | Admitting: General Surgery

## 2010-12-11 VITALS — BP 122/82 | HR 58

## 2010-12-11 DIAGNOSIS — K648 Other hemorrhoids: Secondary | ICD-10-CM

## 2010-12-11 NOTE — Patient Instructions (Signed)
Restart Coumadin in 5 days. Have blood checked for Coumadin level 5 days after that.

## 2010-12-11 NOTE — Progress Notes (Signed)
[name] returns for rubber band ligation of her right posterior bleeding int. Hemorrhoid.  She has been off her Coumadin for 5 days.  Procedure:  Anoscopy shows an enlarged right post. Int. Hem.  Rubber band ligation was performed.  No bleeding. She tolerated it well.  She was instructed to restart her Coumadin in 5 days and then have her INR checked 5 days later.  F/u in 3-4 weeks.  She was told to expect some bleeding.

## 2010-12-18 ENCOUNTER — Other Ambulatory Visit: Payer: Self-pay | Admitting: Internal Medicine

## 2010-12-20 ENCOUNTER — Ambulatory Visit (INDEPENDENT_AMBULATORY_CARE_PROVIDER_SITE_OTHER): Payer: Medicare Other | Admitting: *Deleted

## 2010-12-20 DIAGNOSIS — I4891 Unspecified atrial fibrillation: Secondary | ICD-10-CM

## 2010-12-27 ENCOUNTER — Ambulatory Visit (INDEPENDENT_AMBULATORY_CARE_PROVIDER_SITE_OTHER): Payer: Medicare Other | Admitting: *Deleted

## 2010-12-27 DIAGNOSIS — I4891 Unspecified atrial fibrillation: Secondary | ICD-10-CM

## 2011-01-10 ENCOUNTER — Other Ambulatory Visit: Payer: Self-pay | Admitting: Internal Medicine

## 2011-01-15 ENCOUNTER — Encounter: Payer: Medicare Other | Admitting: *Deleted

## 2011-01-16 ENCOUNTER — Ambulatory Visit (INDEPENDENT_AMBULATORY_CARE_PROVIDER_SITE_OTHER): Payer: Medicare Other | Admitting: *Deleted

## 2011-01-16 DIAGNOSIS — I4891 Unspecified atrial fibrillation: Secondary | ICD-10-CM

## 2011-01-26 ENCOUNTER — Other Ambulatory Visit: Payer: Self-pay | Admitting: Internal Medicine

## 2011-01-29 LAB — DIFFERENTIAL
Lymphocytes Relative: 9 — ABNORMAL LOW
Lymphs Abs: 0.9
Neutrophils Relative %: 83 — ABNORMAL HIGH

## 2011-01-29 LAB — CROSSMATCH
ABO/RH(D): O NEG
Antibody Screen: POSITIVE

## 2011-01-29 LAB — CBC
HCT: 37.3
MCV: 96.5
MCV: 97
Platelets: 161
Platelets: 232
RDW: 13.6
WBC: 8.1
WBC: 9.6

## 2011-01-29 LAB — COMPREHENSIVE METABOLIC PANEL
AST: 53 — ABNORMAL HIGH
CO2: 27
Calcium: 9.7
Creatinine, Ser: 1.12
GFR calc Af Amer: 56 — ABNORMAL LOW
GFR calc non Af Amer: 46 — ABNORMAL LOW
Glucose, Bld: 169 — ABNORMAL HIGH

## 2011-01-29 LAB — PROTIME-INR
INR: 4.6 — ABNORMAL HIGH
Prothrombin Time: 29.2 — ABNORMAL HIGH
Prothrombin Time: 46.7 — ABNORMAL HIGH

## 2011-01-29 LAB — BASIC METABOLIC PANEL
BUN: 6
CO2: 28
Calcium: 8.3 — ABNORMAL LOW
Creatinine, Ser: 0.95
GFR calc Af Amer: >60
GFR calc non Af Amer: 56 — ABNORMAL LOW

## 2011-01-29 LAB — PREPARE FRESH FROZEN PLASMA

## 2011-01-29 LAB — TSH: TSH: 2.603

## 2011-01-29 LAB — APTT: aPTT: 54 — ABNORMAL HIGH

## 2011-02-06 ENCOUNTER — Ambulatory Visit (INDEPENDENT_AMBULATORY_CARE_PROVIDER_SITE_OTHER): Payer: Medicare Other | Admitting: General Surgery

## 2011-02-06 ENCOUNTER — Encounter (INDEPENDENT_AMBULATORY_CARE_PROVIDER_SITE_OTHER): Payer: Self-pay | Admitting: General Surgery

## 2011-02-06 VITALS — BP 116/68 | HR 64 | Temp 97.1°F | Resp 14 | Ht 64.0 in | Wt 151.6 lb

## 2011-02-06 DIAGNOSIS — K648 Other hemorrhoids: Secondary | ICD-10-CM

## 2011-02-06 NOTE — Progress Notes (Signed)
[name] is here for followup of her bleeding internal hemorrhoids. She underwent rubber band ligation December 11, 2010. She has not had any bleeding since that time.  Exam: anorectal-no external lesions noted; no masses on digital rectal exam.  Anoscopy-no significant internal hemorrhoid disease  Assessment: Bleeding internal hemorrhoids-good response to rubber band ligation  Plan: Return visit p.r.n.

## 2011-02-13 ENCOUNTER — Ambulatory Visit (INDEPENDENT_AMBULATORY_CARE_PROVIDER_SITE_OTHER): Payer: Medicare Other | Admitting: *Deleted

## 2011-02-13 DIAGNOSIS — I4891 Unspecified atrial fibrillation: Secondary | ICD-10-CM

## 2011-02-13 DIAGNOSIS — Z7901 Long term (current) use of anticoagulants: Secondary | ICD-10-CM

## 2011-02-13 LAB — POCT INR: INR: 2.1

## 2011-02-23 ENCOUNTER — Other Ambulatory Visit: Payer: Self-pay | Admitting: Internal Medicine

## 2011-03-13 ENCOUNTER — Ambulatory Visit (INDEPENDENT_AMBULATORY_CARE_PROVIDER_SITE_OTHER): Payer: Medicare Other | Admitting: *Deleted

## 2011-03-13 DIAGNOSIS — I4891 Unspecified atrial fibrillation: Secondary | ICD-10-CM

## 2011-03-13 DIAGNOSIS — Z7901 Long term (current) use of anticoagulants: Secondary | ICD-10-CM

## 2011-03-15 ENCOUNTER — Other Ambulatory Visit: Payer: Self-pay | Admitting: Internal Medicine

## 2011-03-18 ENCOUNTER — Other Ambulatory Visit (INDEPENDENT_AMBULATORY_CARE_PROVIDER_SITE_OTHER): Payer: Medicare Other

## 2011-03-18 ENCOUNTER — Other Ambulatory Visit: Payer: Self-pay | Admitting: *Deleted

## 2011-03-18 DIAGNOSIS — Z0000 Encounter for general adult medical examination without abnormal findings: Secondary | ICD-10-CM

## 2011-03-18 DIAGNOSIS — R209 Unspecified disturbances of skin sensation: Secondary | ICD-10-CM

## 2011-03-18 DIAGNOSIS — R202 Paresthesia of skin: Secondary | ICD-10-CM

## 2011-03-18 LAB — CBC WITH DIFFERENTIAL/PLATELET
Basophils Absolute: 0 10*3/uL (ref 0.0–0.1)
HCT: 42.2 % (ref 36.0–46.0)
Lymphocytes Relative: 22.2 % (ref 12.0–46.0)
Lymphs Abs: 1.4 10*3/uL (ref 0.7–4.0)
Monocytes Relative: 8.3 % (ref 3.0–12.0)
Neutrophils Relative %: 67.1 % (ref 43.0–77.0)
Platelets: 198 10*3/uL (ref 150.0–400.0)
RDW: 14.2 % (ref 11.5–14.6)

## 2011-03-18 LAB — VITAMIN B12: Vitamin B-12: 931 pg/mL — ABNORMAL HIGH (ref 211–911)

## 2011-03-18 LAB — COMPREHENSIVE METABOLIC PANEL
ALT: 20 U/L (ref 0–35)
Albumin: 4 g/dL (ref 3.5–5.2)
CO2: 28 mEq/L (ref 19–32)
Calcium: 9.7 mg/dL (ref 8.4–10.5)
Chloride: 108 mEq/L (ref 96–112)
GFR: 48.23 mL/min — ABNORMAL LOW (ref >60.00)
Potassium: 5.3 mEq/L — ABNORMAL HIGH (ref 3.5–5.1)
Sodium: 143 mEq/L (ref 135–145)
Total Protein: 7.2 g/dL (ref 6.0–8.3)

## 2011-03-18 LAB — LIPID PANEL: Total CHOL/HDL Ratio: 3

## 2011-03-18 LAB — TSH: TSH: 2.08 u[IU]/mL (ref 0.35–5.50)

## 2011-03-18 MED ORDER — WARFARIN SODIUM 5 MG PO TABS: 5.0000 mg | ORAL_TABLET | Freq: Every day | ORAL | Status: DC

## 2011-03-19 LAB — VITAMIN D 25 HYDROXY (VIT D DEFICIENCY, FRACTURES): Vit D, 25-Hydroxy: 59 ng/mL (ref 30–89)

## 2011-03-20 LAB — PROTEIN ELECTROPHORESIS, SERUM
Beta 2: 5.5 % (ref 3.2–6.5)
Beta Globulin: 5.8 % (ref 4.7–7.2)

## 2011-03-25 ENCOUNTER — Encounter: Payer: Self-pay | Admitting: Internal Medicine

## 2011-03-25 ENCOUNTER — Ambulatory Visit (INDEPENDENT_AMBULATORY_CARE_PROVIDER_SITE_OTHER): Payer: Medicare Other | Admitting: Internal Medicine

## 2011-03-25 DIAGNOSIS — I2510 Atherosclerotic heart disease of native coronary artery without angina pectoris: Secondary | ICD-10-CM

## 2011-03-25 DIAGNOSIS — M545 Low back pain: Secondary | ICD-10-CM

## 2011-03-25 DIAGNOSIS — M810 Age-related osteoporosis without current pathological fracture: Secondary | ICD-10-CM

## 2011-03-25 DIAGNOSIS — E119 Type 2 diabetes mellitus without complications: Secondary | ICD-10-CM

## 2011-03-25 DIAGNOSIS — I4891 Unspecified atrial fibrillation: Secondary | ICD-10-CM

## 2011-03-25 DIAGNOSIS — I10 Essential (primary) hypertension: Secondary | ICD-10-CM

## 2011-03-25 NOTE — Progress Notes (Signed)
  Subjective:    Patient ID: [name], female    DOB: December 18, 1920, 77 y.o.   MRN: [id number]  HPI  The patient presents for a follow-up of  chronic hypertension, A. Fib, chronic dyslipidemia, type 2 diabetes controlled with medicines    Review of Systems  Constitutional: Negative for chills, activity change, appetite change, fatigue and unexpected weight change.  HENT: Negative for congestion, mouth sores and sinus pressure.   Eyes: Negative for visual disturbance.  Respiratory: Negative for cough and chest tightness.   Gastrointestinal: Negative for nausea and abdominal pain.  Genitourinary: Negative for frequency, difficulty urinating and vaginal pain.  Musculoskeletal: Negative for back pain and gait problem.  Skin: Negative for pallor and rash.  Neurological: Negative for dizziness, tremors, weakness, numbness and headaches.  Psychiatric/Behavioral: Negative for suicidal ideas, confusion and sleep disturbance.       Objective:   Physical Exam  Constitutional: She appears well-developed and well-nourished. No distress.  HENT:  Head: Normocephalic.  Right Ear: External ear normal.  Left Ear: External ear normal.  Nose: Nose normal.  Mouth/Throat: Oropharynx is clear and moist.  Eyes: Conjunctivae are normal. Pupils are equal, round, and reactive to light. Right eye exhibits no discharge. Left eye exhibits no discharge.  Neck: Normal range of motion. Neck supple. No JVD present. No tracheal deviation present. No thyromegaly present.  Cardiovascular: Normal rate, regular rhythm and normal heart sounds.   Pulmonary/Chest: No stridor. No respiratory distress. She has no wheezes.  Abdominal: Soft. Bowel sounds are normal. She exhibits no distension and no mass. There is no tenderness. There is no rebound and no guarding.  Musculoskeletal: She exhibits no edema and no tenderness.  Lymphadenopathy:    She has no cervical adenopathy.  Neurological: She displays normal reflexes.  No cranial nerve deficit. She exhibits normal muscle tone. Coordination normal.  Skin: No rash noted. No erythema.  Psychiatric: She has a normal mood and affect. Her behavior is normal. Judgment and thought content normal.     Lab Results  Component Value Date   WBC 6.1 03/18/2011   HGB 15.0 03/18/2011   HCT 42.2 03/18/2011   PLT 198.0 03/18/2011   GLUCOSE 114* 03/18/2011   CHOL 158 03/18/2011   TRIG 177.0* 03/18/2011   HDL 56.00 03/18/2011   LDLDIRECT 100.5 09/18/2009   LDLCALC 67 03/18/2011   ALT 20 03/18/2011   AST 34 03/18/2011   NA 143 03/18/2011   K 5.3* 03/18/2011   CL 108 03/18/2011   CREATININE 1.1 03/18/2011   BUN 18 03/18/2011   CO2 28 03/18/2011   TSH 2.08 03/18/2011   INR 2.3 03/13/2011   HGBA1C 6.6* 09/16/2010        Assessment & Plan:

## 2011-03-25 NOTE — Assessment & Plan Note (Signed)
Continue with current prescription therapy as reflected on the Med list.  

## 2011-04-10 ENCOUNTER — Ambulatory Visit (INDEPENDENT_AMBULATORY_CARE_PROVIDER_SITE_OTHER): Payer: Medicare Other | Admitting: *Deleted

## 2011-04-10 DIAGNOSIS — I4891 Unspecified atrial fibrillation: Secondary | ICD-10-CM

## 2011-04-10 DIAGNOSIS — Z7901 Long term (current) use of anticoagulants: Secondary | ICD-10-CM

## 2011-04-10 LAB — POCT INR: INR: 2.3

## 2011-04-25 ENCOUNTER — Ambulatory Visit (INDEPENDENT_AMBULATORY_CARE_PROVIDER_SITE_OTHER): Payer: Medicare Other | Admitting: *Deleted

## 2011-04-25 DIAGNOSIS — Z7901 Long term (current) use of anticoagulants: Secondary | ICD-10-CM

## 2011-04-25 DIAGNOSIS — I4891 Unspecified atrial fibrillation: Secondary | ICD-10-CM

## 2011-04-26 ENCOUNTER — Other Ambulatory Visit: Payer: Self-pay | Admitting: Internal Medicine

## 2011-05-16 ENCOUNTER — Ambulatory Visit (INDEPENDENT_AMBULATORY_CARE_PROVIDER_SITE_OTHER): Payer: Medicare Other | Admitting: *Deleted

## 2011-05-16 DIAGNOSIS — I4891 Unspecified atrial fibrillation: Secondary | ICD-10-CM

## 2011-05-16 DIAGNOSIS — Z7901 Long term (current) use of anticoagulants: Secondary | ICD-10-CM

## 2011-06-12 ENCOUNTER — Other Ambulatory Visit: Payer: Self-pay | Admitting: Internal Medicine

## 2011-06-13 ENCOUNTER — Ambulatory Visit (INDEPENDENT_AMBULATORY_CARE_PROVIDER_SITE_OTHER): Payer: Medicare Other

## 2011-06-13 DIAGNOSIS — I4891 Unspecified atrial fibrillation: Secondary | ICD-10-CM

## 2011-06-13 DIAGNOSIS — Z7901 Long term (current) use of anticoagulants: Secondary | ICD-10-CM

## 2011-06-13 LAB — POCT INR: INR: 2

## 2011-06-30 ENCOUNTER — Telehealth: Payer: Self-pay | Admitting: Cardiology

## 2011-06-30 NOTE — Telephone Encounter (Signed)
New msg Pt has had chest pain over last several days and pain has went away, no chest pain today. She wants to talk to a nurse to see if she needs to see Dr Antoine Poche. Please call

## 2011-06-30 NOTE — Telephone Encounter (Signed)
Mrs Onofre is requesting an appt.  She has an appt at end of April but wants to be seen sooner.  She states she has had some chest and arm pain recently (2 times) that was relieved by one ntg.  She has also been having dizzy spells off and on x one week.  She has been taking her bp but does not know what it has been running.

## 2011-06-30 NOTE — Telephone Encounter (Signed)
Pt c/o arm and chest pain off and on for the past several days with dizziness.  Pt was given an appointment with [name] for tomorrow at 2pm.  Pt has no chest or arm pain at this time.

## 2011-07-01 ENCOUNTER — Ambulatory Visit (INDEPENDENT_AMBULATORY_CARE_PROVIDER_SITE_OTHER): Payer: Medicare Other | Admitting: Physician Assistant

## 2011-07-01 ENCOUNTER — Encounter: Payer: Self-pay | Admitting: Physician Assistant

## 2011-07-01 ENCOUNTER — Telehealth: Payer: Self-pay | Admitting: Cardiology

## 2011-07-01 VITALS — BP 138/74 | HR 66 | Ht 64.5 in | Wt 150.0 lb

## 2011-07-01 DIAGNOSIS — R42 Dizziness and giddiness: Secondary | ICD-10-CM

## 2011-07-01 DIAGNOSIS — R079 Chest pain, unspecified: Secondary | ICD-10-CM

## 2011-07-01 DIAGNOSIS — I10 Essential (primary) hypertension: Secondary | ICD-10-CM

## 2011-07-01 DIAGNOSIS — I509 Heart failure, unspecified: Secondary | ICD-10-CM

## 2011-07-01 DIAGNOSIS — R0602 Shortness of breath: Secondary | ICD-10-CM

## 2011-07-01 DIAGNOSIS — I5032 Chronic diastolic (congestive) heart failure: Secondary | ICD-10-CM

## 2011-07-01 DIAGNOSIS — I4891 Unspecified atrial fibrillation: Secondary | ICD-10-CM

## 2011-07-01 DIAGNOSIS — I2510 Atherosclerotic heart disease of native coronary artery without angina pectoris: Secondary | ICD-10-CM

## 2011-07-01 MED ORDER — ISOSORBIDE MONONITRATE ER 30 MG PO TB24: 15.0000 mg | ORAL_TABLET | Freq: Every day | ORAL | Status: DC

## 2011-07-01 NOTE — Progress Notes (Signed)
[address] [place], [place]  [address] Phone: [phone] Fax:  [phone]  Date:  07/01/2011   Name:  [name]       DOB:  May 15, 1920 MRN:  [id number]  PCP:  Dr. Posey Rea Primary Cardiologist:  Dr. Rollene Rotunda  Primary Electrophysiologist:  None    History of Present Illness: [name] is a 78 y.o. female who walked in to the clinic to be seen today.  She has a history of CAD, status post CABG, diastolic heart failure, atrial fibrillation, hypertension, hyperlipidemia, hypothyroidism.  She has a history of prior PCI to the native circumflex via the vein graft.  Last LHC 8/04: EF 75%, ostial LAD occluded, SVG-DX diffusely diseased in the proximal segment was ulcerated stenosis of up to 80% and occluded, circumflex occluded, SVG-OM 2 patent with 50% stenosis, distal circumflex angioplasty site 40%, RCA serial 60%.  PCI was elected and the patient underwent Taxus drug is eluting stenting of the SVG-diagonal.   Last echo 4/06: EF 55-65%, mild MAC, mild BAE.  Last nuclear study 4/06: Small fixed apical lateral defect likely artifactual, no large reversible perfusion defect to suggest ischemia, EF 63%.  Carotid Dopplers 8/09:0-39% bilateral.  She was last seen by Dr. Antoine Poche 4/11.  Over the last 2 weeks, she notes dizziness in the morning described as spinning.  Does not occur every day.  She denies syncope or near syncope.  She had one episode of chest pressure and left arm pain.  Noticed it after awakening.  Took NTG with relief.  Not that active.  Describes DOE (class 2b-3).  Sleeps on an incline.  Not clear if she did this for GERD or for dyspnea.  No PND.  Has had some mild ankle edema.  No weight changes.  Has noted some interscapular back pain.  She is not certain if her symptoms are reminiscent of prior angina.  She has not had any exertional chest symptoms.    Past Medical History  Diagnosis Date  . Anemia   . Chronic diastolic heart  failure     Dr Antoine Poche  . CAD (coronary artery disease)     s/p CABG; s/p PCI of CFX;  Last LHC 8/04: EF 75%, ostial LAD occluded, SVG-DX diffusely diseased in the proximal segment was ulcerated stenosis of up to 80% and occluded, circumflex occluded, SVG-OM 2 patent with 50% stenosis, distal circumflex angioplasty site 40%, RCA serial 60%.  PCI was elected and the patient underwent Taxus drug is eluting stenting of the SVG-Dx;  Nuc 2006 neg for isch  . Hyperlipidemia   . HTN (hypertension)     echo 4/06: EF 55-65%, mild MAC, mild BAE  . Osteoporosis   . Hip fracture, right   . LBP (low back pain)     Dr Prince Rome  . Atrial fibrillation     coumadin  . Hypothyroidism   . Carotid stenosis     Dopplers 8/09:0-39% bilateral    Current Outpatient Prescriptions  Medication Sig Dispense Refill  . b complex vitamins tablet Take 1 tablet by mouth daily.        . Cholecalciferol 1000 UNITS tablet Take 1,000 Units by mouth daily.        . CRESTOR 20 MG tablet TAKE ONE TABLET BY MOUTH EVERY DAY  30 each  5  . diclofenac sodium (VOLTAREN) 1 % GEL Apply topically 2 (two) times daily as needed.        . fish oil-omega-3  fatty acids 1000 MG capsule Take 1 g by mouth daily.        . furosemide (LASIX) 40 MG tablet Take 20-40 mg by mouth daily as needed.        Marland Kitchen KLOR-CON M20 20 MEQ tablet TAKE ONE TABLET BY MOUTH EVERY DAY  30 each  5  . levothyroxine (SYNTHROID, LEVOTHROID) 50 MCG tablet TAKE ONE TABLET BY MOUTH EVERY DAY FOR THYROID.  30 tablet  5  . metoprolol (LOPRESSOR) 50 MG tablet TAKE ONE AND ONE-HALF TABLETS BY MOUTH TWICE DAILY  90 tablet  5  . nitroGLYCERIN (NITROSTAT) 0.4 MG SL tablet Place 0.4 mg under the tongue every 5 (five) minutes as needed.        . vitamin C (ASCORBIC ACID) 500 MG tablet Take 500 mg by mouth daily.        Marland Kitchen warfarin (COUMADIN) 5 MG tablet TAKE ONE TABLET BY MOUTH DAILY.  45 tablet  11    Allergies: No Known Allergies  History  Substance Use Topics  . Smoking  status: Never Smoker   . Smokeless tobacco: Never Used  . Alcohol Use: Yes     ROS:  Please see the history of present illness.   No fevers, chills, cough, melena, hematochezia, vomiting, diarrhea, dysphagia, water brash.  No pleuritic chest pain.   All other systems reviewed and negative.   PHYSICAL EXAM: VS:  BP 138/74  Pulse 66  Ht 5' 4.5" (1.638 m)  Wt 150 lb (68.04 kg)  BMI 25.35 kg/m2 Well nourished, well developed, in no acute distress HEENT: normal Neck: no JVD Endo: no thyromegaly Cardiac:  normal S1, S2; irreg, irreg; no murmur Lungs:  clear to auscultation bilaterally, no wheezing, rhonchi or rales Abd: soft, nontender, no hepatomegaly Ext: no edema Skin: warm and dry Neuro:  CNs 2-12 intact, no focal abnormalities noted Psych: normal affect  EKG:  Atrial fibrillation, rate 66, normal axis, nonspecific ST-T wave changes  ASSESSMENT AND PLAN:  1. Chest pain, unspecified  Atypical, but with some worrisome features.  We had a long discussion regarding further workup.  She would be comfortable proceeding with cardiac cath if necessary.  Therefore, will start by proceeding with Lexiscan Myoview.  If high risk, proceed with cath.  Start Imdur 15 mg QD to initiate medical therapy.  If myoview low risk and symptoms better with nitrates, continue medical Rx.  Check BMET , BNP and CBC.  Check a CXR.  Follow up with Dr. Rollene Rotunda in 2-3 weeks.   2. Dizziness  Suspect vertigo.  Will check 24 hour holter to rule out bradyarrhythmias.     3. Atrial fibrillation  Rate controlled.  Obtain monitor as noted.  Continue coumadin.     4. Chronic diastolic heart failure  Volume appears stable.  Check BNP and CXR given symptoms.  Adjust diuretics if needed.     5. CORONARY ARTERY DISEASE  Continue statin.  No ASA due to coumadin.  Follow up with Dr. Rollene Rotunda after above testing.   6. HYPERTENSION  Controlled.  Continue current therapy.      Signed, Tereso Newcomer, PA-C   3:14 PM 07/01/2011

## 2011-07-01 NOTE — Patient Instructions (Addendum)
Your physician recommends that you schedule a follow-up appointment in: 2-3 WEEKS WITH DR. [facility]  Your physician has requested that you have a lexiscan myoview DX 786.50. For further information please visit [email or web address] Please follow instruction sheet, as given.  Your physician has recommended that you wear a 24 HOUR holter monitor DX 427.31. Holter monitors are medical devices that record the heart's electrical activity. Doctors most often use these monitors to diagnose arrhythmias. Arrhythmias are problems with the speed or rhythm of the heartbeat. The monitor is a small, portable device. You can wear one while you do your normal daily activities. This is usually used to diagnose what is causing palpitations/syncope (passing out).  A chest x-ray DX 786.50, THIS IS TO BE DONE @ THE [facility] ON 07/02/11 takes a picture of the organs and structures inside the chest, including the heart, lungs, and blood vessels. This test can show several things, including, whether the heart is enlarges; whether fluid is building up in the lungs; and whether pacemaker / defibrillator leads are still in place.  Your physician recommends that you return for lab work in:  BMET, CBC W/DIFF, BNP 786.05, 427.31, 414.04 THESE WILL BE DONE IN THE [facility]

## 2011-07-01 NOTE — Telephone Encounter (Signed)
error 

## 2011-07-02 ENCOUNTER — Ambulatory Visit (INDEPENDENT_AMBULATORY_CARE_PROVIDER_SITE_OTHER)
Admission: RE | Admit: 2011-07-02 | Discharge: 2011-07-02 | Disposition: A | Discharge status: Home / Self Care | Payer: Medicare Other | Source: Ambulatory Visit | Attending: Physician Assistant | Admitting: Physician Assistant

## 2011-07-02 ENCOUNTER — Other Ambulatory Visit (INDEPENDENT_AMBULATORY_CARE_PROVIDER_SITE_OTHER): Payer: Medicare Other

## 2011-07-02 ENCOUNTER — Telehealth: Payer: Self-pay | Admitting: *Deleted

## 2011-07-02 ENCOUNTER — Ambulatory Visit: Payer: Medicare Other | Admitting: Physician Assistant

## 2011-07-02 DIAGNOSIS — R079 Chest pain, unspecified: Secondary | ICD-10-CM

## 2011-07-02 DIAGNOSIS — R0602 Shortness of breath: Secondary | ICD-10-CM

## 2011-07-02 DIAGNOSIS — I10 Essential (primary) hypertension: Secondary | ICD-10-CM

## 2011-07-02 DIAGNOSIS — I509 Heart failure, unspecified: Secondary | ICD-10-CM

## 2011-07-02 DIAGNOSIS — R42 Dizziness and giddiness: Secondary | ICD-10-CM

## 2011-07-02 DIAGNOSIS — I5032 Chronic diastolic (congestive) heart failure: Secondary | ICD-10-CM

## 2011-07-02 LAB — CBC WITH DIFFERENTIAL/PLATELET
Basophils Relative: 0.4 % (ref 0.0–3.0)
Eosinophils Absolute: 0.1 10*3/uL (ref 0.0–0.7)
HCT: 41.9 % (ref 36.0–46.0)
Lymphs Abs: 1.2 10*3/uL (ref 0.7–4.0)
MCHC: 34.4 g/dL (ref 30.0–36.0)
MCV: 99.1 fl (ref 78.0–100.0)
Monocytes Absolute: 0.5 10*3/uL (ref 0.1–1.0)
Neutrophils Relative %: 76.4 % (ref 43.0–77.0)
Platelets: 196 10*3/uL (ref 150.0–400.0)
RBC: 4.23 Mil/uL (ref 3.87–5.11)

## 2011-07-02 LAB — BASIC METABOLIC PANEL
BUN: 24 mg/dL — ABNORMAL HIGH (ref 6–23)
CO2: 26 mEq/L (ref 19–32)
Chloride: 103 mEq/L (ref 96–112)
Creatinine, Ser: 1.1 mg/dL (ref 0.4–1.2)
Potassium: 4 mEq/L (ref 3.5–5.1)

## 2011-07-02 NOTE — Telephone Encounter (Signed)
S/w pt and her sister and her brother about lab and cxr results today and medication dose changes on lasix and K+, pt will take her lasix 20 mg daily and K+ 20 meq daily. Pt will have repeat bmet 07/10/11 while in our office for her stress test and monitor that day. Danielle Rankin

## 2011-07-02 NOTE — Telephone Encounter (Signed)
Message copied by Tarri Fuller on Wed Jul 02, 2011  5:11 PM ------      Message from: [place], [place] T      Created: Wed Jul 02, 2011  1:45 PM       Labs ok      BNP minimally elevated      Medications indicate she takes Lasix 20-40 mg QD as needed and K+ 20 mEq daily      Have her take Lasix 20 mg QD for now      IF SHE HAS BEEN TAKING LASIX 20 mg EVERY DAY for some time - have her increase to Lasix 40 mg QD      Repeat bmet in one week.      Tereso Newcomer, PA-C  1:43 PM 07/02/2011

## 2011-07-07 ENCOUNTER — Telehealth: Payer: Self-pay | Admitting: Cardiology

## 2011-07-07 NOTE — Telephone Encounter (Signed)
New Msg: Pt calling to speak with nurse/MD to report BP to Okey Regal, Teaching laboratory technician. Please return pt call to discuss further.

## 2011-07-07 NOTE — Telephone Encounter (Signed)
pt called and gave me her BP readings from yesterday and today. I will show them to SW and advised pt that if he makes any changes I wcb, pt gave me verbal understanding today. [name]

## 2011-07-10 ENCOUNTER — Encounter (INDEPENDENT_AMBULATORY_CARE_PROVIDER_SITE_OTHER): Payer: Medicare Other

## 2011-07-10 ENCOUNTER — Ambulatory Visit (HOSPITAL_COMMUNITY): Payer: Medicare Other | Attending: Cardiovascular Disease | Admitting: Radiology

## 2011-07-10 ENCOUNTER — Ambulatory Visit (INDEPENDENT_AMBULATORY_CARE_PROVIDER_SITE_OTHER): Payer: Medicare Other | Admitting: *Deleted

## 2011-07-10 VITALS — BP 112/73 | Ht 64.0 in | Wt 150.0 lb

## 2011-07-10 DIAGNOSIS — I10 Essential (primary) hypertension: Secondary | ICD-10-CM

## 2011-07-10 DIAGNOSIS — I2510 Atherosclerotic heart disease of native coronary artery without angina pectoris: Secondary | ICD-10-CM

## 2011-07-10 DIAGNOSIS — E119 Type 2 diabetes mellitus without complications: Secondary | ICD-10-CM | POA: Insufficient documentation

## 2011-07-10 DIAGNOSIS — R079 Chest pain, unspecified: Secondary | ICD-10-CM | POA: Insufficient documentation

## 2011-07-10 DIAGNOSIS — I779 Disorder of arteries and arterioles, unspecified: Secondary | ICD-10-CM | POA: Insufficient documentation

## 2011-07-10 DIAGNOSIS — Z8249 Family history of ischemic heart disease and other diseases of the circulatory system: Secondary | ICD-10-CM | POA: Insufficient documentation

## 2011-07-10 DIAGNOSIS — Z951 Presence of aortocoronary bypass graft: Secondary | ICD-10-CM | POA: Insufficient documentation

## 2011-07-10 DIAGNOSIS — I4891 Unspecified atrial fibrillation: Secondary | ICD-10-CM

## 2011-07-10 DIAGNOSIS — R42 Dizziness and giddiness: Secondary | ICD-10-CM | POA: Insufficient documentation

## 2011-07-10 DIAGNOSIS — Z9861 Coronary angioplasty status: Secondary | ICD-10-CM | POA: Insufficient documentation

## 2011-07-10 DIAGNOSIS — R0609 Other forms of dyspnea: Secondary | ICD-10-CM | POA: Insufficient documentation

## 2011-07-10 DIAGNOSIS — E785 Hyperlipidemia, unspecified: Secondary | ICD-10-CM | POA: Insufficient documentation

## 2011-07-10 DIAGNOSIS — R0989 Other specified symptoms and signs involving the circulatory and respiratory systems: Secondary | ICD-10-CM | POA: Insufficient documentation

## 2011-07-10 LAB — BASIC METABOLIC PANEL
BUN: 23 mg/dL (ref 6–23)
CO2: 29 mEq/L (ref 19–32)
Calcium: 9.5 mg/dL (ref 8.4–10.5)
Creatinine, Ser: 1.3 mg/dL — ABNORMAL HIGH (ref 0.4–1.2)
GFR: 42.12 mL/min — ABNORMAL LOW (ref >60.00)
Glucose, Bld: 120 mg/dL — ABNORMAL HIGH (ref 70–99)
Sodium: 140 mEq/L (ref 135–145)

## 2011-07-10 MED ORDER — TECHNETIUM TC 99M TETROFOSMIN IV KIT
10.0000 | PACK | Freq: Once | INTRAVENOUS | Status: AC | PRN
  Administered 2011-07-10: 10 via INTRAVENOUS

## 2011-07-10 MED ORDER — TECHNETIUM TC 99M TETROFOSMIN IV KIT
30.0000 | PACK | Freq: Once | INTRAVENOUS | Status: AC | PRN
  Administered 2011-07-10: 30 via INTRAVENOUS

## 2011-07-10 MED ORDER — REGADENOSON 0.4 MG/5ML IV SOLN
0.4000 mg | Freq: Once | INTRAVENOUS | Status: AC
  Administered 2011-07-10: 0.4 mg via INTRAVENOUS

## 2011-07-10 NOTE — Progress Notes (Signed)
[facility] SITE 3 NUCLEAR MED [address] [place] [address] [phone]  Cardiology Nuclear Med Study  [name] is a 78 y.o. female     MRN : [id number]     DOB: [birth year]  Procedure Date: 07/10/2011  Nuclear Med Background Indication for Stress Test:  Evaluation for Ischemia, Graft Patency, Stent Patency and PTCA Patency History: Afib, CHF, 1983: CABGx3, '91 Angioplasty: OM-1, 8/04 Heart Cath: EF: 75% SVG-DX diffuse Dz, 9/04 Stents: SVG--Dx 4/06 ECHO: EF: 50-65%, MPS: fixed apical lateral defect (-) ischemia EF: 63% Cardiac Risk Factors: Carotid Disease, Family History - CAD, Hypertension, Lipids and NIDDM  Symptoms:  Chest Pain, Dizziness and DOE   Nuclear Pre-Procedure Caffeine/Decaff Intake:  None NPO After: 7:00pm   Lungs:  clear O2 Sat: 97% on room air. IV 0.9% NS with Angio Cath:  22g  IV Site: R Antecubital  IV Started by:  Stanton Kidney, EMT-P  Chest Size (in):  36 Cup Size: C  Height: 5\' 4"  (1.626 m)  Weight:  150 lb (68.04 kg)  BMI:  Body mass index is 25.75 kg/(m^2). Tech Comments:  Meds taken as directed.    Nuclear Med Study 1 or 2 day study: 1 day  Stress Test Type:  Lexiscan  Reading MD: Charlton Haws, MD  Order Authorizing Provider:  Riel Hirschman [place] MD  Resting Radionuclide: Technetium 75m Tetrofosmin  Resting Radionuclide Dose: 11.0 mCi   Stress Radionuclide:  Technetium 91m Tetrofosmin  Stress Radionuclide Dose: 32.9 mCi           Stress Protocol Rest HR: 56 Stress HR: 64  Rest BP: 112/73 Stress BP: 111/62  Exercise Time (min): n/a METS: n/a   Predicted Max HR: 132 bpm % Max HR: 48.48 bpm Rate Pressure Product: 7168   Dose of Adenosine (mg):  n/a Dose of Lexiscan: 0.4 mg  Dose of Atropine (mg): n/a Dose of Dobutamine: n/a mcg/kg/min (at max HR)  Stress Test Technologist: Milana Na, EMT-P  Nuclear Technologist:  Domenic Polite, CNMT     Rest Procedure:  Myocardial perfusion imaging was performed at rest 45  minutes following the intravenous administration of Technetium 46m Tetrofosmin. Rest ECG: Atrial Fibrilliation  Stress Procedure:  The patient received IV Lexiscan 0.4 mg over 15-seconds.  Technetium 39m Tetrofosmin injected at 30-seconds.  There were no significant changes with Lexiscan.  Quantitative spect images were obtained after a 45 minute delay. Stress ECG: No significant change from baseline ECG  QPS Raw Data Images:  Normal; no motion artifact; normal heart/lung ratio. Stress Images:  Normal homogeneous uptake in all areas of the myocardium. Rest Images:  Normal homogeneous uptake in all areas of the myocardium. Subtraction (SDS):  Normal Transient Ischemic Dilatation (Normal <1.22):  0.94 Lung/Heart Ratio (Normal <0.45):  0.32  Quantitative Gated Spect Images QGS EDV:  57 ml QGS ESV:  18 ml  Impression Exercise Capacity:  Lexiscan with no exercise. BP Response:  Normal blood pressure response. Clinical Symptoms:  Mild chest pain/dyspnea. ECG Impression:  No significant ST segment change suggestive of ischemia. Comparison with Prior Nuclear Study: No images to compare  Overall Impression:  Normal stress nuclear study.  LV Ejection Fraction: 69%.  LV Wall Motion:  NL LV Function; NL Wall Motion    Charlton Haws

## 2011-07-18 ENCOUNTER — Other Ambulatory Visit: Payer: Self-pay | Admitting: Internal Medicine

## 2011-07-25 ENCOUNTER — Ambulatory Visit (INDEPENDENT_AMBULATORY_CARE_PROVIDER_SITE_OTHER): Payer: Medicare Other | Admitting: *Deleted

## 2011-07-25 DIAGNOSIS — I4891 Unspecified atrial fibrillation: Secondary | ICD-10-CM

## 2011-07-25 DIAGNOSIS — Z7901 Long term (current) use of anticoagulants: Secondary | ICD-10-CM

## 2011-07-31 ENCOUNTER — Other Ambulatory Visit: Payer: Self-pay | Admitting: Internal Medicine

## 2011-08-07 ENCOUNTER — Ambulatory Visit (INDEPENDENT_AMBULATORY_CARE_PROVIDER_SITE_OTHER): Payer: Medicare Other | Admitting: Cardiology

## 2011-08-07 ENCOUNTER — Encounter: Payer: Self-pay | Admitting: Cardiology

## 2011-08-07 VITALS — BP 116/64 | HR 68 | Ht 61.0 in | Wt 153.0 lb

## 2011-08-07 DIAGNOSIS — I6529 Occlusion and stenosis of unspecified carotid artery: Secondary | ICD-10-CM

## 2011-08-07 DIAGNOSIS — I4891 Unspecified atrial fibrillation: Secondary | ICD-10-CM

## 2011-08-07 DIAGNOSIS — I2510 Atherosclerotic heart disease of native coronary artery without angina pectoris: Secondary | ICD-10-CM

## 2011-08-07 NOTE — Assessment & Plan Note (Signed)
I will have her reduce her metoprolol 25 mg twice a day. She'll then let me know she has any continued dizziness.

## 2011-08-07 NOTE — Patient Instructions (Signed)
Your physician recommends that you schedule a follow-up appointment in: 4 months  DECREASE METOPROLOL TO 25 MG TWICE DAILY

## 2011-08-07 NOTE — Assessment & Plan Note (Signed)
She had very mild plaquing we will follow this up periodically.

## 2011-08-07 NOTE — Assessment & Plan Note (Signed)
The patient has no new sypmtoms.  No further cardiovascular testing is indicated.  We will continue with aggressive risk reduction and meds as listed.  

## 2011-08-07 NOTE — Progress Notes (Signed)
HPI The patient returns for followup of joint pain. She was seen here recently by Tereso Newcomer PAc.  She was complaining of dizziness. He had her wear a Holter monitor which demonstrated atrial fibrillation with a slow ventricular rate. We reviewed this today and she has been instructed to reduce her beta blocker dose although she didn't reduce as much as I had wanted the other day when we sent a message. She does say she is less lightheaded. She's not had any presyncope or syncope. She denies any chest pressure, neck or arm discomfort. She's had no PND or orthopnea.  No Known Allergies  Current Outpatient Prescriptions  Medication Sig Dispense Refill  . b complex vitamins tablet Take 1 tablet by mouth daily.        . Cholecalciferol 1000 UNITS tablet Take 1,000 Units by mouth daily.        . CRESTOR 20 MG tablet TAKE ONE TABLET BY MOUTH EVERY DAY  30 each  5  . diclofenac sodium (VOLTAREN) 1 % GEL Apply topically 2 (two) times daily as needed.        . fish oil-omega-3 fatty acids 1000 MG capsule Take 1 g by mouth daily.        . furosemide (LASIX) 40 MG tablet TAKE ONE-HALF TO ONE TABLET BY MOUTH IN THE MORNING. (WATER PILL).  30 tablet  2  . isosorbide mononitrate (IMDUR) 30 MG 24 hr tablet Take 0.5 tablets (15 mg total) by mouth daily.  30 tablet  11  . KLOR-CON M20 20 MEQ tablet TAKE ONE TABLET BY MOUTH EVERY DAY  30 each  5  . levothyroxine (SYNTHROID, LEVOTHROID) 50 MCG tablet TAKE ONE TABLET BY MOUTH EVERY DAY FOR THYROID  30 tablet  5  . metoprolol (LOPRESSOR) 50 MG tablet TAKE ONE AND ONE-HALF TABLETS BY MOUTH TWICE DAILY  90 tablet  5  . nitroGLYCERIN (NITROSTAT) 0.4 MG SL tablet Place 0.4 mg under the tongue every 5 (five) minutes as needed.        . vitamin C (ASCORBIC ACID) 500 MG tablet Take 500 mg by mouth daily.        Marland Kitchen warfarin (COUMADIN) 5 MG tablet TAKE ONE TABLET BY MOUTH DAILY.  45 tablet  11    Past Medical History  Diagnosis Date  . Anemia   . Chronic diastolic  heart failure     Dr Antoine Poche  . CAD (coronary artery disease)     s/p CABG; s/p PCI of CFX;  Last LHC 8/04: EF 75%, ostial LAD occluded, SVG-DX diffusely diseased in the proximal segment was ulcerated stenosis of up to 80% and occluded, circumflex occluded, SVG-OM 2 patent with 50% stenosis, distal circumflex angioplasty site 40%, RCA serial 60%.  PCI was elected and the patient underwent Taxus drug is eluting stenting of the SVG-Dx;  Nuc 2006 neg for isch  . Hyperlipidemia   . HTN (hypertension)     echo 4/06: EF 55-65%, mild MAC, mild BAE  . Osteoporosis   . Hip fracture, right   . LBP (low back pain)     Dr Prince Rome  . Atrial fibrillation     coumadin  . Hypothyroidism   . Carotid stenosis     Dopplers 8/09:0-39% bilateral    Past Surgical History  Procedure Date  . Total hip arthroplasty     Right  . Cardiac surgery     triple bypass  . Abdominal hysterectomy   . Coronary angioplasty with stent placement   .  Hemorrhoid surgery     ROS:  As stated in the HPI and negative for all other systems.  PHYSICAL EXAM BP 116/64  Pulse 68  Ht 5\' 1"  (1.549 m)  Wt 153 lb (69.4 kg)  BMI 28.91 kg/m2 GENERAL:  Well appearing HEENT:  Pupils equal round and reactive, fundi not visualized, oral mucosa unremarkable NECK:  No jugular venous distention, waveform within normal limits, carotid upstroke brisk and symmetric, no bruits, no thyromegaly LYMPHATICS:  No cervical, inguinal adenopathy LUNGS:  Clear to auscultation bilaterally BACK:  No CVA tenderness CHEST:  Well healed sternotomy scar. HEART:  PMI not displaced or sustained,S1 and S2 within normal limits, no S3, no clicks, no rubs, no murmurs, irregular ABD:  Flat, positive bowel sounds normal in frequency in pitch, no bruits, no rebound, no guarding, no midline pulsatile mass, no hepatomegaly, no splenomegaly EXT:  2 plus pulses throughout, no edema, no cyanosis no clubbing SKIN:  No rashes no nodules NEURO:  Cranial nerves II  through XII grossly intact, motor grossly intact throughout PSYCH:  Cognitively intact, oriented to person place and time   ASSESSMENT AND PLAN

## 2011-08-22 ENCOUNTER — Ambulatory Visit (INDEPENDENT_AMBULATORY_CARE_PROVIDER_SITE_OTHER): Payer: Medicare Other | Admitting: Pharmacist

## 2011-08-22 DIAGNOSIS — I4891 Unspecified atrial fibrillation: Secondary | ICD-10-CM

## 2011-08-22 DIAGNOSIS — Z7901 Long term (current) use of anticoagulants: Secondary | ICD-10-CM

## 2011-08-30 ENCOUNTER — Other Ambulatory Visit: Payer: Self-pay | Admitting: Internal Medicine

## 2011-09-16 ENCOUNTER — Other Ambulatory Visit: Payer: Self-pay | Admitting: *Deleted

## 2011-09-16 ENCOUNTER — Other Ambulatory Visit (INDEPENDENT_AMBULATORY_CARE_PROVIDER_SITE_OTHER): Payer: Medicare Other

## 2011-09-16 DIAGNOSIS — D649 Anemia, unspecified: Secondary | ICD-10-CM

## 2011-09-16 DIAGNOSIS — E785 Hyperlipidemia, unspecified: Secondary | ICD-10-CM

## 2011-09-16 DIAGNOSIS — E119 Type 2 diabetes mellitus without complications: Secondary | ICD-10-CM

## 2011-09-16 DIAGNOSIS — E039 Hypothyroidism, unspecified: Secondary | ICD-10-CM

## 2011-09-16 LAB — COMPREHENSIVE METABOLIC PANEL
AST: 44 U/L — ABNORMAL HIGH (ref 0–37)
Alkaline Phosphatase: 47 U/L (ref 39–117)
BUN: 18 mg/dL (ref 6–23)
Creatinine, Ser: 1.3 mg/dL — ABNORMAL HIGH (ref 0.4–1.2)

## 2011-09-16 LAB — CBC WITH DIFFERENTIAL/PLATELET
Basophils Relative: 0.6 % (ref 0.0–3.0)
Eosinophils Relative: 1.8 % (ref 0.0–5.0)
HCT: 45.3 % (ref 36.0–46.0)
Lymphs Abs: 1.5 10*3/uL (ref 0.7–4.0)
Monocytes Relative: 8.3 % (ref 3.0–12.0)
Neutrophils Relative %: 64.6 % (ref 43.0–77.0)
Platelets: 202 10*3/uL (ref 150.0–400.0)
RBC: 4.51 Mil/uL (ref 3.87–5.11)
WBC: 6 10*3/uL (ref 4.5–10.5)

## 2011-09-16 LAB — LIPID PANEL
Cholesterol: 170 mg/dL (ref 0–200)
LDL Cholesterol: 76 mg/dL (ref 0–99)
Triglycerides: 156 mg/dL — ABNORMAL HIGH (ref 0.0–149.0)
VLDL: 31.2 mg/dL (ref 0.0–40.0)

## 2011-09-16 LAB — TSH: TSH: 2.48 u[IU]/mL (ref 0.35–5.50)

## 2011-09-17 ENCOUNTER — Other Ambulatory Visit: Payer: Self-pay | Admitting: Internal Medicine

## 2011-09-17 DIAGNOSIS — Z1231 Encounter for screening mammogram for malignant neoplasm of breast: Secondary | ICD-10-CM

## 2011-09-23 ENCOUNTER — Ambulatory Visit (INDEPENDENT_AMBULATORY_CARE_PROVIDER_SITE_OTHER): Payer: Medicare Other | Admitting: Internal Medicine

## 2011-09-23 ENCOUNTER — Encounter: Payer: Self-pay | Admitting: Internal Medicine

## 2011-09-23 VITALS — BP 98/60 | HR 76 | Temp 96.0°F | Resp 16 | Wt 150.0 lb

## 2011-09-23 DIAGNOSIS — E785 Hyperlipidemia, unspecified: Secondary | ICD-10-CM

## 2011-09-23 DIAGNOSIS — I4891 Unspecified atrial fibrillation: Secondary | ICD-10-CM

## 2011-09-23 DIAGNOSIS — E039 Hypothyroidism, unspecified: Secondary | ICD-10-CM

## 2011-09-23 DIAGNOSIS — E119 Type 2 diabetes mellitus without complications: Secondary | ICD-10-CM

## 2011-09-23 DIAGNOSIS — I10 Essential (primary) hypertension: Secondary | ICD-10-CM

## 2011-09-23 DIAGNOSIS — I5032 Chronic diastolic (congestive) heart failure: Secondary | ICD-10-CM

## 2011-09-23 NOTE — Assessment & Plan Note (Signed)
Call if BP is too low Continue with current prescription therapy as reflected on the Med list.

## 2011-09-23 NOTE — Assessment & Plan Note (Signed)
Continue with current prescription therapy as reflected on the Med list.  

## 2011-09-23 NOTE — Assessment & Plan Note (Signed)
  On diet  

## 2011-09-23 NOTE — Progress Notes (Signed)
Patient ID: [name], female   DOB: 04-29-1920, 78 y.o.   MRN: [id number]  Subjective:    Patient ID: [name], female    DOB: 1921-01-18, 78 y.o.   MRN: [id number]  HPI  The patient presents for a follow-up of  chronic hypertension, A. Fib, chronic dyslipidemia, type 2 diabetes controlled with medicines  BP Readings from Last 3 Encounters:  09/23/11 98/60  08/07/11 116/64  07/10/11 112/73   Wt Readings from Last 3 Encounters:  09/23/11 150 lb (68.04 kg)  08/07/11 153 lb (69.4 kg)  07/10/11 150 lb (68.04 kg)       Review of Systems  Constitutional: Negative for chills, activity change, appetite change, fatigue and unexpected weight change.  HENT: Negative for congestion, mouth sores and sinus pressure.   Eyes: Negative for visual disturbance.  Respiratory: Negative for cough and chest tightness.   Gastrointestinal: Negative for nausea and abdominal pain.  Genitourinary: Negative for frequency, difficulty urinating and vaginal pain.  Musculoskeletal: Negative for back pain and gait problem.  Skin: Negative for pallor and rash.  Neurological: Negative for dizziness, tremors, weakness, numbness and headaches.  Psychiatric/Behavioral: Negative for suicidal ideas, confusion and sleep disturbance.       Objective:   Physical Exam  Constitutional: She appears well-developed and well-nourished. No distress.  HENT:  Head: Normocephalic.  Right Ear: External ear normal.  Left Ear: External ear normal.  Nose: Nose normal.  Mouth/Throat: Oropharynx is clear and moist.  Eyes: Conjunctivae are normal. Pupils are equal, round, and reactive to light. Right eye exhibits no discharge. Left eye exhibits no discharge.  Neck: Normal range of motion. Neck supple. No JVD present. No tracheal deviation present. No thyromegaly present.  Cardiovascular: Normal rate, regular rhythm and normal heart sounds.   Pulmonary/Chest: No stridor. No respiratory distress. She has no wheezes.    Abdominal: Soft. Bowel sounds are normal. She exhibits no distension and no mass. There is no tenderness. There is no rebound and no guarding.  Musculoskeletal: She exhibits no edema and no tenderness.  Lymphadenopathy:    She has no cervical adenopathy.  Neurological: She displays normal reflexes. No cranial nerve deficit. She exhibits normal muscle tone. Coordination normal.  Skin: No rash noted. No erythema.  Psychiatric: She has a normal mood and affect. Her behavior is normal. Judgment and thought content normal.     Lab Results  Component Value Date   WBC 6.0 09/16/2011   HGB 15.3* 09/16/2011   HCT 45.3 09/16/2011   PLT 202.0 09/16/2011   GLUCOSE 103* 09/16/2011   CHOL 170 09/16/2011   TRIG 156.0* 09/16/2011   HDL 63.20 09/16/2011   LDLDIRECT 100.5 09/18/2009   LDLCALC 76 09/16/2011   ALT 21 09/16/2011   AST 44* 09/16/2011   NA 144 09/16/2011   K 5.4* 09/16/2011   CL 108 09/16/2011   CREATININE 1.3* 09/16/2011   BUN 18 09/16/2011   CO2 31 09/16/2011   TSH 2.48 09/16/2011   INR 2.3 08/22/2011   HGBA1C 6.6* 09/16/2010        Assessment & Plan:

## 2011-10-01 ENCOUNTER — Ambulatory Visit: Payer: Medicare Other

## 2011-10-02 ENCOUNTER — Ambulatory Visit
Admission: RE | Admit: 2011-10-02 | Discharge: 2011-10-02 | Disposition: A | Discharge status: Home / Self Care | Payer: Medicare Other | Source: Ambulatory Visit | Attending: Internal Medicine | Admitting: Internal Medicine

## 2011-10-02 DIAGNOSIS — Z1231 Encounter for screening mammogram for malignant neoplasm of breast: Secondary | ICD-10-CM

## 2011-10-03 ENCOUNTER — Ambulatory Visit (INDEPENDENT_AMBULATORY_CARE_PROVIDER_SITE_OTHER): Payer: Medicare Other | Admitting: *Deleted

## 2011-10-03 DIAGNOSIS — Z7901 Long term (current) use of anticoagulants: Secondary | ICD-10-CM

## 2011-10-03 DIAGNOSIS — I4891 Unspecified atrial fibrillation: Secondary | ICD-10-CM

## 2011-11-14 ENCOUNTER — Ambulatory Visit (INDEPENDENT_AMBULATORY_CARE_PROVIDER_SITE_OTHER): Payer: Medicare Other

## 2011-11-14 DIAGNOSIS — I4891 Unspecified atrial fibrillation: Secondary | ICD-10-CM

## 2011-11-14 DIAGNOSIS — Z7901 Long term (current) use of anticoagulants: Secondary | ICD-10-CM

## 2011-11-14 LAB — POCT INR: INR: 2.6

## 2011-11-25 ENCOUNTER — Other Ambulatory Visit: Payer: Self-pay | Admitting: Internal Medicine

## 2011-11-26 ENCOUNTER — Other Ambulatory Visit: Payer: Self-pay | Admitting: Internal Medicine

## 2011-12-02 ENCOUNTER — Ambulatory Visit (INDEPENDENT_AMBULATORY_CARE_PROVIDER_SITE_OTHER): Payer: Medicare Other | Admitting: Cardiology

## 2011-12-02 ENCOUNTER — Encounter: Payer: Self-pay | Admitting: Cardiology

## 2011-12-02 VITALS — BP 110/68 | HR 76 | Ht 65.0 in | Wt 148.0 lb

## 2011-12-02 DIAGNOSIS — I4891 Unspecified atrial fibrillation: Secondary | ICD-10-CM

## 2011-12-02 LAB — BASIC METABOLIC PANEL
Calcium: 9.6 mg/dL (ref 8.4–10.5)
GFR: 48.65 mL/min — ABNORMAL LOW (ref >60.00)
Glucose, Bld: 115 mg/dL — ABNORMAL HIGH (ref 70–99)
Potassium: 4.1 mEq/L (ref 3.5–5.1)
Sodium: 137 mEq/L (ref 135–145)

## 2011-12-02 MED ORDER — NITROGLYCERIN 0.4 MG SL SUBL: 0.4000 mg | SUBLINGUAL_TABLET | SUBLINGUAL | Status: DC | PRN

## 2011-12-02 NOTE — Patient Instructions (Addendum)
The current medical regimen is effective;  continue present plan and medications.  Please have blood work today.  Follow up in 1 year with Dr Hochrein.  You will receive a letter in the mail 2 months before you are due.  Please call us when you receive this letter to schedule your follow up appointment.  

## 2011-12-02 NOTE — Progress Notes (Signed)
HPI The patient presents for follow up of atrial fibrillation.  At the last appt her HR was low and so I lowered the beta blocker.  Since that time she has felt well and has had no dizziness such as was bothering her before.  The patient denies any new symptoms such as chest discomfort, neck or arm discomfort. There has been no new shortness of breath, PND or orthopnea. There have been no reported palpitations, presyncope or syncope.  No Known Allergies  Current Outpatient Prescriptions  Medication Sig Dispense Refill  . b complex vitamins tablet Take 1 tablet by mouth daily.        . Cholecalciferol 1000 UNITS tablet Take 1,000 Units by mouth daily.        . CRESTOR 20 MG tablet TAKE ONE TABLET BY MOUTH EVERY DAY  30 each  5  . diclofenac sodium (VOLTAREN) 1 % GEL Apply topically 2 (two) times daily as needed.        . furosemide (LASIX) 40 MG tablet TAKE ONE-HALF TO ONE TABLET BY MOUTH IN THE MORNING. (WATER PILL).  30 tablet  2  . isosorbide mononitrate (IMDUR) 30 MG 24 hr tablet Take 0.5 tablets (15 mg total) by mouth daily.  30 tablet  11  . KLOR-CON M20 20 MEQ tablet TAKE ONE TABLET BY MOUTH EVERY DAY  30 each  4  . levothyroxine (SYNTHROID, LEVOTHROID) 50 MCG tablet TAKE ONE TABLET BY MOUTH EVERY DAY FOR THYROID  30 tablet  5  . metoprolol (LOPRESSOR) 50 MG tablet TAKE ONE AND ONE-HALF TABLETS BY MOUTH TWICE DAILY  90 tablet  5  . nitroGLYCERIN (NITROSTAT) 0.4 MG SL tablet Place 1 tablet (0.4 mg total) under the tongue every 5 (five) minutes as needed.  25 tablet  11  . vitamin C (ASCORBIC ACID) 500 MG tablet Take 500 mg by mouth daily.        Marland Kitchen warfarin (COUMADIN) 5 MG tablet TAKE ONE TABLET BY MOUTH DAILY.  45 tablet  11  . DISCONTD: nitroGLYCERIN (NITROSTAT) 0.4 MG SL tablet Place 0.4 mg under the tongue every 5 (five) minutes as needed.          Past Medical History  Diagnosis Date  . Anemia   . Chronic diastolic heart failure     Dr Antoine Poche  . CAD (coronary artery  disease)     s/p CABG; s/p PCI of CFX;  Last LHC 8/04: EF 75%, ostial LAD occluded, SVG-DX diffusely diseased in the proximal segment was ulcerated stenosis of up to 80% and occluded, circumflex occluded, SVG-OM 2 patent with 50% stenosis, distal circumflex angioplasty site 40%, RCA serial 60%.  PCI was elected and the patient underwent Taxus drug is eluting stenting of the SVG-Dx;  Nuc 2006 neg for isch  . Hyperlipidemia   . HTN (hypertension)     echo 4/06: EF 55-65%, mild MAC, mild BAE  . Osteoporosis   . Hip fracture, right   . LBP (low back pain)     Dr Prince Rome  . Atrial fibrillation     coumadin  . Hypothyroidism   . Carotid stenosis     Dopplers 8/09:0-39% bilateral    Past Surgical History  Procedure Date  . Total hip arthroplasty     Right  . Cardiac surgery     triple bypass  . Abdominal hysterectomy   . Coronary angioplasty with stent placement   . Hemorrhoid surgery     ROS:  As stated  in the HPI and negative for all other systems.  PHYSICAL EXAM BP 110/68  Pulse 76  Ht 5\' 5"  (1.651 m)  Wt 148 lb (67.132 kg)  BMI 24.63 kg/m2 GENERAL:  Well appearing NECK:  No jugular venous distention, waveform within normal limits, carotid upstroke brisk and symmetric, no bruits, no thyromegaly LYMPHATICS:  No cervical, inguinal adenopathy LUNGS:  Clear to auscultation bilaterally CHEST:  Well healed sternotomy scar. HEART:  PMI not displaced or sustained,S1 and S2 within normal limits, no S3, no clicks, no rubs, no murmurs, irregular ABD:  Flat, positive bowel sounds normal in frequency in pitch, no bruits, no rebound, no guarding, no midline pulsatile mass, no hepatomegaly, no splenomegaly EXT:  2 plus pulses throughout, no edema, no cyanosis no clubbing SKIN:  No rashes no nodules    ASSESSMENT AND PLAN  Atrial fibrillation -  The patient  tolerates this rhythm and rate control and anticoagulation. We will continue with the meds as listed.  CAROTID ARTERY DISEASE -    This is mild and we will follow up periodically.  CORONARY ARTERY DISEASE -  She had a negative stress perfusion study earlier this year and no change in therapy is indicated.

## 2011-12-08 ENCOUNTER — Telehealth: Payer: Self-pay | Admitting: Cardiology

## 2011-12-08 NOTE — Telephone Encounter (Signed)
error 

## 2011-12-17 ENCOUNTER — Encounter: Payer: Self-pay | Admitting: Pharmacist

## 2011-12-26 ENCOUNTER — Ambulatory Visit (INDEPENDENT_AMBULATORY_CARE_PROVIDER_SITE_OTHER): Payer: Medicare Other | Admitting: *Deleted

## 2011-12-26 DIAGNOSIS — I4891 Unspecified atrial fibrillation: Secondary | ICD-10-CM

## 2011-12-26 DIAGNOSIS — Z7901 Long term (current) use of anticoagulants: Secondary | ICD-10-CM

## 2011-12-26 LAB — POCT INR: INR: 2.5

## 2012-01-13 ENCOUNTER — Other Ambulatory Visit: Payer: Self-pay | Admitting: Internal Medicine

## 2012-01-26 ENCOUNTER — Other Ambulatory Visit: Payer: Self-pay | Admitting: Internal Medicine

## 2012-01-27 ENCOUNTER — Other Ambulatory Visit: Payer: Self-pay | Admitting: General Practice

## 2012-01-27 MED ORDER — FUROSEMIDE 40 MG PO TABS: 40.0000 mg | ORAL_TABLET | Freq: Every day | ORAL | Status: DC

## 2012-02-06 ENCOUNTER — Ambulatory Visit (INDEPENDENT_AMBULATORY_CARE_PROVIDER_SITE_OTHER): Payer: Medicare Other | Admitting: *Deleted

## 2012-02-06 DIAGNOSIS — Z7901 Long term (current) use of anticoagulants: Secondary | ICD-10-CM

## 2012-02-06 DIAGNOSIS — I4891 Unspecified atrial fibrillation: Secondary | ICD-10-CM

## 2012-03-19 ENCOUNTER — Ambulatory Visit (INDEPENDENT_AMBULATORY_CARE_PROVIDER_SITE_OTHER): Payer: Medicare Other | Admitting: *Deleted

## 2012-03-19 DIAGNOSIS — I4891 Unspecified atrial fibrillation: Secondary | ICD-10-CM

## 2012-03-19 DIAGNOSIS — Z7901 Long term (current) use of anticoagulants: Secondary | ICD-10-CM

## 2012-03-19 LAB — POCT INR: INR: 3.3

## 2012-03-26 ENCOUNTER — Encounter: Payer: Self-pay | Admitting: Internal Medicine

## 2012-03-26 ENCOUNTER — Ambulatory Visit (INDEPENDENT_AMBULATORY_CARE_PROVIDER_SITE_OTHER): Payer: Medicare Other | Admitting: Internal Medicine

## 2012-03-26 ENCOUNTER — Other Ambulatory Visit (INDEPENDENT_AMBULATORY_CARE_PROVIDER_SITE_OTHER): Payer: Medicare Other

## 2012-03-26 VITALS — BP 148/80 | HR 80 | Temp 97.0°F | Resp 16 | Ht 64.0 in | Wt 148.0 lb

## 2012-03-26 DIAGNOSIS — I2510 Atherosclerotic heart disease of native coronary artery without angina pectoris: Secondary | ICD-10-CM

## 2012-03-26 DIAGNOSIS — I10 Essential (primary) hypertension: Secondary | ICD-10-CM

## 2012-03-26 DIAGNOSIS — M810 Age-related osteoporosis without current pathological fracture: Secondary | ICD-10-CM

## 2012-03-26 DIAGNOSIS — M545 Low back pain, unspecified: Secondary | ICD-10-CM

## 2012-03-26 DIAGNOSIS — R739 Hyperglycemia, unspecified: Secondary | ICD-10-CM

## 2012-03-26 DIAGNOSIS — E119 Type 2 diabetes mellitus without complications: Secondary | ICD-10-CM

## 2012-03-26 DIAGNOSIS — I4891 Unspecified atrial fibrillation: Secondary | ICD-10-CM

## 2012-03-26 DIAGNOSIS — R202 Paresthesia of skin: Secondary | ICD-10-CM

## 2012-03-26 DIAGNOSIS — R209 Unspecified disturbances of skin sensation: Secondary | ICD-10-CM

## 2012-03-26 DIAGNOSIS — R7309 Other abnormal glucose: Secondary | ICD-10-CM

## 2012-03-26 LAB — URINALYSIS, ROUTINE W REFLEX MICROSCOPIC
Bilirubin Urine: NEGATIVE
Ketones, ur: NEGATIVE
Nitrite: POSITIVE
Total Protein, Urine: NEGATIVE
Urine Glucose: NEGATIVE
pH: 6.5 (ref 5.0–8.0)

## 2012-03-26 LAB — CBC WITH DIFFERENTIAL/PLATELET
Basophils Relative: 0.6 % (ref 0.0–3.0)
Eosinophils Relative: 1.7 % (ref 0.0–5.0)
HCT: 42.9 % (ref 36.0–46.0)
Lymphs Abs: 1.3 10*3/uL (ref 0.7–4.0)
MCV: 100.7 fl — ABNORMAL HIGH (ref 78.0–100.0)
Monocytes Absolute: 0.5 10*3/uL (ref 0.1–1.0)
Monocytes Relative: 7.6 % (ref 3.0–12.0)
Neutrophils Relative %: 71.9 % (ref 43.0–77.0)
RBC: 4.26 Mil/uL (ref 3.87–5.11)
WBC: 7 10*3/uL (ref 4.5–10.5)

## 2012-03-26 LAB — BASIC METABOLIC PANEL
BUN: 23 mg/dL (ref 6–23)
CO2: 27 mEq/L (ref 19–32)
GFR: 43.63 mL/min — ABNORMAL LOW (ref >60.00)
Glucose, Bld: 121 mg/dL — ABNORMAL HIGH (ref 70–99)
Potassium: 4.4 mEq/L (ref 3.5–5.1)
Sodium: 140 mEq/L (ref 135–145)

## 2012-03-26 LAB — HEPATIC FUNCTION PANEL
ALT: 21 U/L (ref 0–35)
Albumin: 4.4 g/dL (ref 3.5–5.2)
Alkaline Phosphatase: 48 U/L (ref 39–117)
Bilirubin, Direct: 0.2 mg/dL (ref 0.0–0.3)
Total Protein: 7.6 g/dL (ref 6.0–8.3)

## 2012-03-26 MED ORDER — LEVOTHYROXINE SODIUM 50 MCG PO TABS: 50.0000 ug | ORAL_TABLET | Freq: Every day | ORAL | Status: DC

## 2012-03-26 MED ORDER — POTASSIUM CHLORIDE CRYS ER 20 MEQ PO TBCR: 20.0000 meq | EXTENDED_RELEASE_TABLET | Freq: Every day | ORAL | Status: DC

## 2012-03-26 MED ORDER — ROSUVASTATIN CALCIUM 20 MG PO TABS: 20.0000 mg | ORAL_TABLET | Freq: Every day | ORAL | Status: DC

## 2012-03-26 MED ORDER — FUROSEMIDE 40 MG PO TABS: 40.0000 mg | ORAL_TABLET | Freq: Every day | ORAL | Status: DC

## 2012-03-26 MED ORDER — WARFARIN SODIUM 5 MG PO TABS: ORAL_TABLET | ORAL | Status: DC

## 2012-03-26 NOTE — Assessment & Plan Note (Signed)
Better  

## 2012-03-26 NOTE — Assessment & Plan Note (Signed)
Continue with current prescription therapy as reflected on the Med list.  

## 2012-03-26 NOTE — Progress Notes (Signed)
   Subjective:    Patient ID: [name], female    DOB: 02/18/1921, 78 y.o.   MRN: [id number]  HPI  The patient presents for a follow-up of  chronic hypertension, A. Fib, chronic dyslipidemia, type 2 diabetes controlled with medicines  BP Readings from Last 3 Encounters:  03/26/12 148/80  12/02/11 110/68  09/23/11 98/60   Wt Readings from Last 3 Encounters:  03/26/12 148 lb (67.132 kg)  12/02/11 148 lb (67.132 kg)  09/23/11 150 lb (68.04 kg)       Review of Systems  Constitutional: Negative for chills, activity change, appetite change, fatigue and unexpected weight change.  HENT: Negative for congestion, mouth sores and sinus pressure.   Eyes: Negative for visual disturbance.  Respiratory: Negative for cough and chest tightness.   Gastrointestinal: Negative for nausea and abdominal pain.  Genitourinary: Negative for frequency, difficulty urinating and vaginal pain.  Musculoskeletal: Negative for back pain and gait problem.  Skin: Negative for pallor and rash.  Neurological: Negative for dizziness, tremors, weakness, numbness and headaches.  Psychiatric/Behavioral: Negative for suicidal ideas, confusion and sleep disturbance.       Objective:   Physical Exam  Constitutional: She appears well-developed and well-nourished. No distress.  HENT:  Head: Normocephalic.  Right Ear: External ear normal.  Left Ear: External ear normal.  Nose: Nose normal.  Mouth/Throat: Oropharynx is clear and moist.  Eyes: Conjunctivae normal are normal. Pupils are equal, round, and reactive to light. Right eye exhibits no discharge. Left eye exhibits no discharge.  Neck: Normal range of motion. Neck supple. No JVD present. No tracheal deviation present. No thyromegaly present.  Cardiovascular: Normal rate, regular rhythm and normal heart sounds.   Pulmonary/Chest: No stridor. No respiratory distress. She has no wheezes.  Abdominal: Soft. Bowel sounds are normal. She exhibits no distension  and no mass. There is no tenderness. There is no rebound and no guarding.  Musculoskeletal: She exhibits no edema and no tenderness.  Lymphadenopathy:    She has no cervical adenopathy.  Neurological: She displays normal reflexes. No cranial nerve deficit. She exhibits normal muscle tone. Coordination normal.  Skin: No rash noted. No erythema.  Psychiatric: She has a normal mood and affect. Her behavior is normal. Judgment and thought content normal.     Lab Results  Component Value Date   WBC 6.0 09/16/2011   HGB 15.3* 09/16/2011   HCT 45.3 09/16/2011   PLT 202.0 09/16/2011   GLUCOSE 115* 12/02/2011   CHOL 170 09/16/2011   TRIG 156.0* 09/16/2011   HDL 63.20 09/16/2011   LDLDIRECT 100.5 09/18/2009   LDLCALC 76 09/16/2011   ALT 21 09/16/2011   AST 44* 09/16/2011   NA 137 12/02/2011   K 4.1 12/02/2011   CL 103 12/02/2011   CREATININE 1.1 12/02/2011   BUN 19 12/02/2011   CO2 25 12/02/2011   TSH 2.48 09/16/2011   INR 3.3 03/19/2012   HGBA1C 6.6* 09/16/2010        Assessment & Plan:

## 2012-03-26 NOTE — Assessment & Plan Note (Signed)
Watching labs 

## 2012-03-28 ENCOUNTER — Encounter: Payer: Self-pay | Admitting: Internal Medicine

## 2012-03-29 ENCOUNTER — Telehealth: Payer: Self-pay | Admitting: Internal Medicine

## 2012-03-29 MED ORDER — CIPROFLOXACIN HCL 250 MG PO TABS: 250.0000 mg | ORAL_TABLET | Freq: Every day | ORAL | Status: DC

## 2012-03-29 NOTE — Telephone Encounter (Signed)
[name], please, inform patient that all labs are normal except for meld glu elev and a mild UTI: Take Cipro Cut back on sugar a little Thx

## 2012-03-29 NOTE — Telephone Encounter (Signed)
Pt informed

## 2012-04-09 ENCOUNTER — Ambulatory Visit (INDEPENDENT_AMBULATORY_CARE_PROVIDER_SITE_OTHER): Payer: Medicare Other | Admitting: *Deleted

## 2012-04-09 DIAGNOSIS — I4891 Unspecified atrial fibrillation: Secondary | ICD-10-CM

## 2012-04-09 DIAGNOSIS — Z7901 Long term (current) use of anticoagulants: Secondary | ICD-10-CM

## 2012-04-27 ENCOUNTER — Ambulatory Visit (INDEPENDENT_AMBULATORY_CARE_PROVIDER_SITE_OTHER): Payer: Medicare Other

## 2012-04-27 DIAGNOSIS — Z7901 Long term (current) use of anticoagulants: Secondary | ICD-10-CM

## 2012-04-27 DIAGNOSIS — I4891 Unspecified atrial fibrillation: Secondary | ICD-10-CM

## 2012-05-25 ENCOUNTER — Ambulatory Visit (INDEPENDENT_AMBULATORY_CARE_PROVIDER_SITE_OTHER): Payer: Medicare Other | Admitting: *Deleted

## 2012-05-25 DIAGNOSIS — Z7901 Long term (current) use of anticoagulants: Secondary | ICD-10-CM

## 2012-05-25 DIAGNOSIS — I4891 Unspecified atrial fibrillation: Secondary | ICD-10-CM

## 2012-05-25 LAB — POCT INR: INR: 2.4

## 2012-05-31 ENCOUNTER — Other Ambulatory Visit: Payer: Self-pay | Admitting: *Deleted

## 2012-05-31 MED ORDER — POTASSIUM CHLORIDE CRYS ER 20 MEQ PO TBCR: 20.0000 meq | EXTENDED_RELEASE_TABLET | Freq: Every day | ORAL | Status: DC

## 2012-06-19 ENCOUNTER — Other Ambulatory Visit: Payer: Self-pay | Admitting: Internal Medicine

## 2012-06-25 ENCOUNTER — Other Ambulatory Visit: Payer: Self-pay | Admitting: *Deleted

## 2012-06-25 ENCOUNTER — Ambulatory Visit (INDEPENDENT_AMBULATORY_CARE_PROVIDER_SITE_OTHER): Payer: Medicare Other | Admitting: *Deleted

## 2012-06-25 DIAGNOSIS — I4891 Unspecified atrial fibrillation: Secondary | ICD-10-CM

## 2012-06-25 DIAGNOSIS — Z7901 Long term (current) use of anticoagulants: Secondary | ICD-10-CM

## 2012-06-25 LAB — POCT INR: INR: 2.4

## 2012-06-25 MED ORDER — FUROSEMIDE 40 MG PO TABS: 40.0000 mg | ORAL_TABLET | Freq: Every day | ORAL | Status: DC

## 2012-06-29 ENCOUNTER — Other Ambulatory Visit: Payer: Self-pay | Admitting: General Practice

## 2012-06-29 MED ORDER — WARFARIN SODIUM 5 MG PO TABS: ORAL_TABLET | ORAL | Status: DC

## 2012-07-12 ENCOUNTER — Telehealth: Payer: Self-pay | Admitting: Cardiology

## 2012-07-12 NOTE — Telephone Encounter (Signed)
New Prob   Pain and ache in left arm. Would like to be worked in before May. Has not contacted PCP and would like to speak to nurse.

## 2012-07-12 NOTE — Telephone Encounter (Signed)
Pt called because she said, needs to be seen by Dr. Antoine Poche before June 2014. Pt states has been having an aching on her left arm for a month. Pt denies any other symptoms. Pt state that she is going to be 79 years old in June , and she needs to be alive for that. Pt is aware that the next opening on MD's schedule is in May. Pt states she needs to be seen sooner. She would like to speak with MD's nurse because she can put her in MD schedule and it won't take too long for MD to see her.

## 2012-07-13 ENCOUNTER — Other Ambulatory Visit: Payer: Self-pay | Admitting: Internal Medicine

## 2012-07-13 ENCOUNTER — Other Ambulatory Visit: Payer: Self-pay | Admitting: Physician Assistant

## 2012-07-14 ENCOUNTER — Other Ambulatory Visit: Payer: Self-pay | Admitting: *Deleted

## 2012-07-14 MED ORDER — METOPROLOL TARTRATE 50 MG PO TABS: ORAL_TABLET | ORAL | Status: DC

## 2012-07-14 NOTE — Telephone Encounter (Signed)
Per pt call states she has been having an aching feeling in her left arm off and on for some time  She would like itto be checked out.  appt given of 9am 4/3  Pt is aware and is agreeable.

## 2012-07-15 ENCOUNTER — Encounter: Payer: Self-pay | Admitting: Cardiology

## 2012-07-15 ENCOUNTER — Ambulatory Visit (INDEPENDENT_AMBULATORY_CARE_PROVIDER_SITE_OTHER): Payer: Medicare Other | Admitting: Cardiology

## 2012-07-15 VITALS — BP 108/68 | HR 66 | Resp 14 | Ht 63.0 in | Wt 144.8 lb

## 2012-07-15 DIAGNOSIS — I10 Essential (primary) hypertension: Secondary | ICD-10-CM

## 2012-07-15 DIAGNOSIS — I4891 Unspecified atrial fibrillation: Secondary | ICD-10-CM

## 2012-07-15 DIAGNOSIS — I2510 Atherosclerotic heart disease of native coronary artery without angina pectoris: Secondary | ICD-10-CM

## 2012-07-15 DIAGNOSIS — I5032 Chronic diastolic (congestive) heart failure: Secondary | ICD-10-CM

## 2012-07-15 DIAGNOSIS — I6529 Occlusion and stenosis of unspecified carotid artery: Secondary | ICD-10-CM

## 2012-07-15 NOTE — Patient Instructions (Addendum)
The current medical regimen is effective;  continue present plan and medications.  Your physician has requested that you have a lexiscan myoview. For further information please visit [email or web address] Please follow instruction sheet, as given.  Your physician has requested that you have a carotid duplex. This test is an ultrasound of the carotid arteries in your neck. It looks at blood flow through these arteries that supply the brain with blood. Allow one hour for this exam. There are no restrictions or special instructions.  Follow up as scheduled or based on these results.

## 2012-07-15 NOTE — Progress Notes (Signed)
HPI The patient presents for evaluation of arm pain.  She reports having left arm discomfort the last month.  It is sporadic. It happens at rest. She doesn't think that she's had this discomfort before. It goes from her elbow down to her wrist. It is moderate in intensity with no associated symptoms. She cannot bring it on with activity such as walking through the grocery store. She does get dyspneic with exertion but has no PND or orthopnea. She's not describing chest or neck discomfort. She's not had any new palpitations, presyncope or syncope. However, she was concerned about this discomfort. She says it does go away after several minutes spontaneously.   No Known Allergies  Current Outpatient Prescriptions  Medication Sig Dispense Refill  . b complex vitamins tablet Take 1 tablet by mouth daily.        . Cholecalciferol 1000 UNITS tablet Take 1,000 Units by mouth daily.        . CRESTOR 20 MG tablet TAKE ONE TABLET BY MOUTH EVERY DAY  30 tablet  3  . furosemide (LASIX) 40 MG tablet Take 1 tablet (40 mg total) by mouth daily.  30 tablet  5  . isosorbide mononitrate (IMDUR) 30 MG 24 hr tablet TAKE ONE-HALF TABLET BY MOUTH EVERY DAY  30 tablet  5  . levothyroxine (SYNTHROID, LEVOTHROID) 50 MCG tablet Take 1 tablet (50 mcg total) by mouth daily.  90 tablet  3  . metoprolol (LOPRESSOR) 50 MG tablet TAKE ONE AND ONE-HALF TABLETS BY MOUTH TWICE DAILY  90 tablet  5  . nitroGLYCERIN (NITROSTAT) 0.4 MG SL tablet Place 1 tablet (0.4 mg total) under the tongue every 5 (five) minutes as needed.  25 tablet  11  . potassium chloride SA (KLOR-CON M20) 20 MEQ tablet Take 1 tablet (20 mEq total) by mouth daily.  30 tablet  5  . vitamin C (ASCORBIC ACID) 500 MG tablet Take 500 mg by mouth daily.        Marland Kitchen warfarin (COUMADIN) 5 MG tablet As directed  120 tablet  0  . ciprofloxacin (CIPRO) 250 MG tablet Take 1 tablet (250 mg total) by mouth daily.  5 tablet  0  . diclofenac sodium (VOLTAREN) 1 % GEL Apply  topically 2 (two) times daily as needed.         No current facility-administered medications for this visit.    Past Medical History  Diagnosis Date  . Anemia   . Chronic diastolic heart failure     Dr Antoine Poche  . CAD (coronary artery disease)     s/p CABG; s/p PCI of CFX;  Last LHC 8/04: EF 75%, ostial LAD occluded, SVG-DX diffusely diseased in the proximal segment was ulcerated stenosis of up to 80% and occluded, circumflex occluded, SVG-OM 2 patent with 50% stenosis, distal circumflex angioplasty site 40%, RCA serial 60%.  PCI was elected and the patient underwent Taxus drug is eluting stenting of the SVG-Dx;  Nuc 2006 neg for isch  . Hyperlipidemia   . HTN (hypertension)     echo 4/06: EF 55-65%, mild MAC, mild BAE  . Osteoporosis   . Hip fracture, right   . LBP (low back pain)     Dr Prince Rome  . Atrial fibrillation     coumadin  . Hypothyroidism   . Carotid stenosis     Dopplers 8/09:0-39% bilateral    Past Surgical History  Procedure Laterality Date  . Total hip arthroplasty      Right  .  Cardiac surgery      triple bypass  . Abdominal hysterectomy    . Coronary angioplasty with stent placement    . Hemorrhoid surgery      ROS:  As stated in the HPI and negative for all other systems.  PHYSICAL EXAM BP 108/68  Pulse 66  Resp 14  Ht 5\' 3"  (1.6 m)  Wt 144 lb 12.8 oz (65.681 kg)  BMI 25.66 kg/m2 GENERAL:  Well appearing NECK:  No jugular venous distention, waveform within normal limits, carotid upstroke brisk and symmetric, no bruits, no thyromegaly LYMPHATICS:  No cervical, inguinal adenopathy LUNGS:  Clear to auscultation bilaterally CHEST:  Well healed sternotomy scar. HEART:  PMI not displaced or sustained,S1 and S2 within normal limits, no S3, no clicks, no rubs, no murmurs, irregular ABD:  Flat, positive bowel sounds normal in frequency in pitch, no bruits, no rebound, no guarding, no midline pulsatile mass, no hepatomegaly, no splenomegaly EXT:  2 plus  pulses throughout, no edema, no cyanosis no clubbing SKIN:  No rashes no nodules  EKG:  Atrial fibrillation, rate 66, axis within normal limits, intervals WNL, no acute ST-T wave changes.  07/15/2012   ASSESSMENT AND PLAN  Atrial fibrillation -  The patient  tolerates this rhythm and rate control and anticoagulation. We will continue with the meds as listed.  CAROTID ARTERY DISEASE -  This is mild and we will follow up with repeat carotid Dopplers.   CORONARY ARTERY DISEASE -  Given the new arm pain I will repeat a stress test.  I will schedule a Lexiscan Myoview.

## 2012-07-23 ENCOUNTER — Ambulatory Visit (INDEPENDENT_AMBULATORY_CARE_PROVIDER_SITE_OTHER): Payer: Medicare Other | Admitting: Pharmacist

## 2012-07-23 DIAGNOSIS — I4891 Unspecified atrial fibrillation: Secondary | ICD-10-CM

## 2012-07-23 DIAGNOSIS — Z7901 Long term (current) use of anticoagulants: Secondary | ICD-10-CM

## 2012-08-02 ENCOUNTER — Ambulatory Visit (HOSPITAL_COMMUNITY): Payer: Medicare Other | Attending: Cardiology | Admitting: Radiology

## 2012-08-02 VITALS — BP 134/80 | HR 70 | Ht 65.5 in | Wt 142.0 lb

## 2012-08-02 DIAGNOSIS — I209 Angina pectoris, unspecified: Secondary | ICD-10-CM

## 2012-08-02 DIAGNOSIS — R000 Tachycardia, unspecified: Secondary | ICD-10-CM | POA: Insufficient documentation

## 2012-08-02 DIAGNOSIS — R0989 Other specified symptoms and signs involving the circulatory and respiratory systems: Secondary | ICD-10-CM | POA: Insufficient documentation

## 2012-08-02 DIAGNOSIS — Z9861 Coronary angioplasty status: Secondary | ICD-10-CM | POA: Insufficient documentation

## 2012-08-02 DIAGNOSIS — I4891 Unspecified atrial fibrillation: Secondary | ICD-10-CM

## 2012-08-02 DIAGNOSIS — I10 Essential (primary) hypertension: Secondary | ICD-10-CM | POA: Insufficient documentation

## 2012-08-02 DIAGNOSIS — I2510 Atherosclerotic heart disease of native coronary artery without angina pectoris: Secondary | ICD-10-CM

## 2012-08-02 DIAGNOSIS — R0609 Other forms of dyspnea: Secondary | ICD-10-CM | POA: Insufficient documentation

## 2012-08-02 DIAGNOSIS — R0602 Shortness of breath: Secondary | ICD-10-CM

## 2012-08-02 DIAGNOSIS — Z8249 Family history of ischemic heart disease and other diseases of the circulatory system: Secondary | ICD-10-CM | POA: Insufficient documentation

## 2012-08-02 DIAGNOSIS — I779 Disorder of arteries and arterioles, unspecified: Secondary | ICD-10-CM | POA: Insufficient documentation

## 2012-08-02 DIAGNOSIS — E119 Type 2 diabetes mellitus without complications: Secondary | ICD-10-CM | POA: Insufficient documentation

## 2012-08-02 DIAGNOSIS — M79609 Pain in unspecified limb: Secondary | ICD-10-CM | POA: Insufficient documentation

## 2012-08-02 DIAGNOSIS — Z951 Presence of aortocoronary bypass graft: Secondary | ICD-10-CM | POA: Insufficient documentation

## 2012-08-02 MED ORDER — TECHNETIUM TC 99M SESTAMIBI GENERIC - CARDIOLITE
10.8000 | Freq: Once | INTRAVENOUS | Status: AC | PRN
  Administered 2012-08-02: 11 via INTRAVENOUS

## 2012-08-02 MED ORDER — TECHNETIUM TC 99M SESTAMIBI GENERIC - CARDIOLITE
33.0000 | Freq: Once | INTRAVENOUS | Status: AC | PRN
  Administered 2012-08-02: 33 via INTRAVENOUS

## 2012-08-02 MED ORDER — REGADENOSON 0.4 MG/5ML IV SOLN
0.4000 mg | Freq: Once | INTRAVENOUS | Status: AC
  Administered 2012-08-02: 0.4 mg via INTRAVENOUS

## 2012-08-02 NOTE — Progress Notes (Signed)
[facility] SITE 3 NUCLEAR MED [address] [place], [place] [address] [phone]    Cardiology Nuclear Med Study  [name] is a 79 y.o. female     MRN : [id number]     DOB: [birth year]  Procedure Date: 08/02/2012  Nuclear Med Background Indication for Stress Test:  Evaluation for Ischemia, Graft Patency and PTCA/Stent Patency History:  '83 CABG; '91 PTCA :OM-1; '04 Stent:SVG-Dx, EF=74%; '06 Echo:EF=65%; '13 OZH:YQMVHQ, EF=69% Cardiac Risk Factors: Carotid Disease, Family History - CAD, Hypertension, Lipids and NIDDM  Symptoms:  DOE, Rapid HR and Left Arm Discomfort (last episode was about 2-weeks ago)   Nuclear Pre-Procedure Caffeine/Decaff Intake:  None > 12 hrs NPO After: 6:00pm   Lungs:  Clear. O2 Sat: 96% on room air. IV 0.9% NS with Angio Cath:  22g  IV Site: R Hand, tolerated well IV Started by:  Irean Hong, RN  Chest Size (in):  36 Cup Size: B  Height: 5' 5.5" (1.664 m)  Weight:  142 lb (64.411 kg)  BMI:  Body mass index is 23.26 kg/(m^2). Tech Comments:  Took medications this morning    Nuclear Med Study 1 or 2 day study: 1 day  Stress Test Type:  Lexiscan  Reading MD: Dietrich Pates, MD  Order Authorizing Provider:  Rollene Rotunda, MD  Resting Radionuclide: Technetium 9m Sestamibi  Resting Radionuclide Dose: 10.8 mCi   Stress Radionuclide:  Technetium 45m Sestamibi  Stress Radionuclide Dose: 33.0 mCi           Stress Protocol Rest HR: 70 Stress HR: 71  Rest BP: 134/80 Stress BP: 130/82  Exercise Time (min): n/a METS: n/a   Predicted Max HR: 130 bpm % Max HR: 54.62 bpm Rate Pressure Product: 9230   Dose of Adenosine (mg):  n/a Dose of Lexiscan: 0.4 mg  Dose of Atropine (mg): n/a Dose of Dobutamine: n/a mcg/kg/min (at max HR)  Stress Test Technologist: Smiley Houseman, CMA-N  Nuclear Technologist:  Domenic Polite, CNMT     Rest Procedure:  Myocardial perfusion imaging was performed at rest 45 minutes following the intravenous  administration of Technetium 54m Sestamibi.  Rest ECG: NSR.  70 bpm.  Stress Procedure:  The patient received IV Lexiscan 0.4 mg over 15-seconds.  Technetium 49m Sestamibi injected at 30-seconds.  Quantitative spect images were obtained after a 45 minute delay.  Stress ECG: No significant change from baseline ECG  QPS Raw Data Images:  Images were motion corrected.  Soft tissue (diaphragm, bowel acitvity) underlie heart. Stress Images:  Normal homogeneous uptake in all areas of the myocardium. Rest Images:  Normal homogeneous uptake in all areas of the myocardium. Subtraction (SDS):  No evidence of ischemia. Transient Ischemic Dilatation (Normal <1.22):  1.02 Lung/Heart Ratio (Normal <0.45):  0.43  Quantitative Gated Spect Images QGS EDV:  n/a QGS ESV:  n/a  Impression Exercise Capacity:  Lexiscan with no exercise. BP Response:  Normal blood pressure response. Clinical Symptoms:  No chest pain. ECG Impression:  No significant ST segment change suggestive of ischemia. Comparison with Prior Nuclear Study: no change from previous report.  Overall Impression:  Normal stress nuclear study.  LV Ejection Fraction: Study not gated.  LV Wall Motion:  Study not gated  [employer]

## 2012-08-03 ENCOUNTER — Encounter (INDEPENDENT_AMBULATORY_CARE_PROVIDER_SITE_OTHER): Payer: Medicare Other

## 2012-08-03 DIAGNOSIS — I6529 Occlusion and stenosis of unspecified carotid artery: Secondary | ICD-10-CM

## 2012-08-20 ENCOUNTER — Ambulatory Visit (INDEPENDENT_AMBULATORY_CARE_PROVIDER_SITE_OTHER): Payer: Medicare Other | Admitting: *Deleted

## 2012-08-20 DIAGNOSIS — I4891 Unspecified atrial fibrillation: Secondary | ICD-10-CM

## 2012-08-20 DIAGNOSIS — Z7901 Long term (current) use of anticoagulants: Secondary | ICD-10-CM

## 2012-09-28 ENCOUNTER — Encounter: Payer: Self-pay | Admitting: Internal Medicine

## 2012-09-28 ENCOUNTER — Ambulatory Visit (INDEPENDENT_AMBULATORY_CARE_PROVIDER_SITE_OTHER): Payer: Medicare Other | Admitting: Internal Medicine

## 2012-09-28 ENCOUNTER — Other Ambulatory Visit (INDEPENDENT_AMBULATORY_CARE_PROVIDER_SITE_OTHER): Payer: Medicare Other

## 2012-09-28 VITALS — BP 120/78 | HR 76 | Temp 96.7°F | Resp 16 | Wt 142.0 lb

## 2012-09-28 DIAGNOSIS — M545 Low back pain, unspecified: Secondary | ICD-10-CM

## 2012-09-28 DIAGNOSIS — E119 Type 2 diabetes mellitus without complications: Secondary | ICD-10-CM

## 2012-09-28 DIAGNOSIS — I10 Essential (primary) hypertension: Secondary | ICD-10-CM

## 2012-09-28 DIAGNOSIS — E785 Hyperlipidemia, unspecified: Secondary | ICD-10-CM

## 2012-09-28 DIAGNOSIS — R413 Other amnesia: Secondary | ICD-10-CM

## 2012-09-28 DIAGNOSIS — I2510 Atherosclerotic heart disease of native coronary artery without angina pectoris: Secondary | ICD-10-CM

## 2012-09-28 MED ORDER — DONEPEZIL HCL 5 MG PO TABS: 5.0000 mg | ORAL_TABLET | Freq: Every evening | ORAL | Status: DC | PRN

## 2012-09-28 NOTE — Assessment & Plan Note (Signed)
Continue with current prescription therapy as reflected on the Med list.  

## 2012-09-28 NOTE — Progress Notes (Signed)
   Subjective:     HPI  The patient presents for a follow-up of  chronic hypertension, A. Fib, chronic dyslipidemia, type 2 diabetes controlled with medicines. C/o memory issues  BP Readings from Last 3 Encounters:  09/28/12 120/78  08/02/12 134/80  07/15/12 108/68   Wt Readings from Last 3 Encounters:  09/28/12 142 lb (64.411 kg)  08/02/12 142 lb (64.411 kg)  07/15/12 144 lb 12.8 oz (65.681 kg)       Review of Systems  Constitutional: Negative for chills, activity change, appetite change, fatigue and unexpected weight change.  HENT: Negative for congestion, mouth sores and sinus pressure.   Eyes: Negative for visual disturbance.  Respiratory: Negative for cough and chest tightness.   Gastrointestinal: Negative for nausea and abdominal pain.  Genitourinary: Negative for frequency, difficulty urinating and vaginal pain.  Musculoskeletal: Negative for back pain and gait problem.  Skin: Negative for pallor and rash.  Neurological: Negative for dizziness, tremors, weakness, numbness and headaches.  Psychiatric/Behavioral: Negative for suicidal ideas, confusion and sleep disturbance.       Objective:   Physical Exam  Constitutional: She appears well-developed and well-nourished. No distress.  HENT:  Head: Normocephalic.  Right Ear: External ear normal.  Left Ear: External ear normal.  Nose: Nose normal.  Mouth/Throat: Oropharynx is clear and moist.  Eyes: Conjunctivae are normal. Pupils are equal, round, and reactive to light. Right eye exhibits no discharge. Left eye exhibits no discharge.  Neck: Normal range of motion. Neck supple. No JVD present. No tracheal deviation present. No thyromegaly present.  Cardiovascular: Normal rate, regular rhythm and normal heart sounds.   Pulmonary/Chest: No stridor. No respiratory distress. She has no wheezes.  Abdominal: Soft. Bowel sounds are normal. She exhibits no distension and no mass. There is no tenderness. There is no rebound  and no guarding.  Musculoskeletal: She exhibits no edema and no tenderness.  Lymphadenopathy:    She has no cervical adenopathy.  Neurological: She displays normal reflexes. No cranial nerve deficit. She exhibits normal muscle tone. Coordination normal.  Skin: No rash noted. No erythema.  Psychiatric: She has a normal mood and affect. Her behavior is normal. Judgment and thought content normal.     Lab Results  Component Value Date   WBC 7.0 03/26/2012   HGB 15.3* 03/26/2012   HCT 42.9 03/26/2012   PLT 214.0 03/26/2012   GLUCOSE 121* 03/26/2012   CHOL 170 09/16/2011   TRIG 156.0* 09/16/2011   HDL 63.20 09/16/2011   LDLDIRECT 100.5 09/18/2009   LDLCALC 76 09/16/2011   ALT 21 03/26/2012   AST 36 03/26/2012   NA 140 03/26/2012   K 4.4 03/26/2012   CL 104 03/26/2012   CREATININE 1.2 03/26/2012   BUN 23 03/26/2012   CO2 27 03/26/2012   TSH 4.24 03/26/2012   INR 2.8 08/20/2012   HGBA1C 6.8* 03/26/2012        Assessment & Plan:

## 2012-09-28 NOTE — Assessment & Plan Note (Signed)
Start Aricept 

## 2012-09-28 NOTE — Patient Instructions (Signed)
Wt Readings from Last 3 Encounters:  09/28/12 142 lb (64.411 kg)  08/02/12 142 lb (64.411 kg)  07/15/12 144 lb 12.8 oz (65.681 kg)

## 2012-09-29 LAB — BASIC METABOLIC PANEL
CO2: 31 mEq/L (ref 19–32)
Calcium: 10.3 mg/dL (ref 8.4–10.5)
Creatinine, Ser: 1.4 mg/dL — ABNORMAL HIGH (ref 0.4–1.2)
Glucose, Bld: 111 mg/dL — ABNORMAL HIGH (ref 70–99)
Sodium: 142 mEq/L (ref 135–145)

## 2012-10-01 ENCOUNTER — Ambulatory Visit (INDEPENDENT_AMBULATORY_CARE_PROVIDER_SITE_OTHER): Payer: Medicare Other | Admitting: *Deleted

## 2012-10-01 DIAGNOSIS — Z7901 Long term (current) use of anticoagulants: Secondary | ICD-10-CM

## 2012-10-01 DIAGNOSIS — I4891 Unspecified atrial fibrillation: Secondary | ICD-10-CM

## 2012-10-01 LAB — POCT INR: INR: 2.7

## 2012-10-04 ENCOUNTER — Other Ambulatory Visit: Payer: Self-pay

## 2012-10-04 DIAGNOSIS — Z1231 Encounter for screening mammogram for malignant neoplasm of breast: Secondary | ICD-10-CM

## 2012-10-14 ENCOUNTER — Other Ambulatory Visit: Payer: Self-pay | Admitting: Internal Medicine

## 2012-11-05 ENCOUNTER — Ambulatory Visit: Payer: Medicare Other

## 2012-11-12 ENCOUNTER — Ambulatory Visit: Payer: Medicare Other

## 2012-11-19 ENCOUNTER — Ambulatory Visit (INDEPENDENT_AMBULATORY_CARE_PROVIDER_SITE_OTHER): Payer: Medicare Other

## 2012-11-19 DIAGNOSIS — I4891 Unspecified atrial fibrillation: Secondary | ICD-10-CM

## 2012-11-19 DIAGNOSIS — Z7901 Long term (current) use of anticoagulants: Secondary | ICD-10-CM

## 2012-12-01 ENCOUNTER — Other Ambulatory Visit: Payer: Self-pay | Admitting: Internal Medicine

## 2012-12-14 ENCOUNTER — Ambulatory Visit (INDEPENDENT_AMBULATORY_CARE_PROVIDER_SITE_OTHER): Payer: Medicare Other | Admitting: Cardiology

## 2012-12-14 ENCOUNTER — Encounter: Payer: Self-pay | Admitting: Cardiology

## 2012-12-14 VITALS — BP 132/78 | HR 70 | Ht 65.5 in | Wt 143.0 lb

## 2012-12-14 DIAGNOSIS — I4891 Unspecified atrial fibrillation: Secondary | ICD-10-CM

## 2012-12-14 NOTE — Progress Notes (Signed)
HPI The patient presents for followup of coronary disease. When I last saw her she was having some arm pain. However, a stress perfusion study did not suggest any new ischemia.  Since I last saw her she has done well.  The patient denies any new symptoms such as chest discomfort, neck or arm discomfort. There has been no new shortness of breath, PND or orthopnea. There have been no reported palpitations, presyncope or syncope.  No Known Allergies  Current Outpatient Prescriptions  Medication Sig Dispense Refill  . b complex vitamins tablet Take 1 tablet by mouth daily.        . Cholecalciferol 1000 UNITS tablet Take 1,000 Units by mouth daily.        Marland Kitchen COUMADIN 5 MG tablet TAKE BY MOUTH AS DIRECTED  120 tablet  1  . CRESTOR 20 MG tablet TAKE 1 TABLET BY MOUTH ONCE DAILY  30 tablet  5  . diclofenac sodium (VOLTAREN) 1 % GEL Apply topically 2 (two) times daily as needed.        . donepezil (ARICEPT) 5 MG tablet Take 1 tablet (5 mg total) by mouth at bedtime as needed.  30 tablet  11  . furosemide (LASIX) 40 MG tablet Take 1 tablet (40 mg total) by mouth daily.  30 tablet  5  . isosorbide mononitrate (IMDUR) 30 MG 24 hr tablet TAKE ONE-HALF TABLET BY MOUTH EVERY DAY  30 tablet  5  . levothyroxine (SYNTHROID, LEVOTHROID) 50 MCG tablet TAKE 1 TABLET BY MOUTH EVERY DAY  90 tablet  1  . metoprolol (LOPRESSOR) 50 MG tablet TAKE ONE AND ONE-HALF TABLETS BY MOUTH TWICE DAILY  90 tablet  5  . nitroGLYCERIN (NITROSTAT) 0.4 MG SL tablet Place 1 tablet (0.4 mg total) under the tongue every 5 (five) minutes as needed.  25 tablet  11  . potassium chloride SA (K-DUR,KLOR-CON) 20 MEQ tablet TAKE 1 TABLET BY MOUTH EVERY DAY  30 tablet  5  . vitamin C (ASCORBIC ACID) 500 MG tablet Take 500 mg by mouth daily.         No current facility-administered medications for this visit.    Past Medical History  Diagnosis Date  . Anemia   . Chronic diastolic heart failure     Dr Antoine Poche  . CAD (coronary artery  disease)     s/p CABG; s/p PCI of CFX;  Last LHC 8/04: EF 75%, ostial LAD occluded, SVG-DX diffusely diseased in the proximal segment was ulcerated stenosis of up to 80% and occluded, circumflex occluded, SVG-OM 2 patent with 50% stenosis, distal circumflex angioplasty site 40%, RCA serial 60%.  PCI was elected and the patient underwent Taxus drug is eluting stenting of the SVG-Dx;  Nuc 2006 neg for isch  . Hyperlipidemia   . HTN (hypertension)     echo 4/06: EF 55-65%, mild MAC, mild BAE  . Osteoporosis   . Hip fracture, right   . LBP (low back pain)     Dr Prince Rome  . Atrial fibrillation     coumadin  . Hypothyroidism   . Carotid stenosis     Dopplers 8/09:0-39% bilateral    Past Surgical History  Procedure Laterality Date  . Total hip arthroplasty      Right  . Cardiac surgery      triple bypass  . Abdominal hysterectomy    . Coronary angioplasty with stent placement    . Hemorrhoid surgery      ROS:  As stated  in the HPI and negative for all other systems.  PHYSICAL EXAM BP 132/78  Pulse 70  Ht 5' 5.5" (1.664 m)  Wt 143 lb (64.864 kg)  BMI 23.43 kg/m2 GENERAL:  Well appearing for her age NECK:  No jugular venous distention, waveform within normal limits, carotid upstroke brisk and symmetric, no bruits, no thyromegaly LYMPHATICS:  No cervical, inguinal adenopathy LUNGS:  Clear to auscultation bilaterally CHEST:  Well healed sternotomy scar. HEART:  PMI not displaced or sustained,S1 and S2 within normal limits, no S3, no clicks, no rubs, no murmurs, irregular ABD:  Flat, positive bowel sounds normal in frequency in pitch, no bruits, no rebound, no guarding, no midline pulsatile mass, no hepatomegaly, no splenomegaly EXT:  2 plus pulses throughout, no edema, no cyanosis no clubbing SKIN:  No rashes no nodules  EKG:  Atrial fibrillation, rate 70, axis within normal limits, intervals WNL, no acute ST-T wave changes.  12/14/2012   ASSESSMENT AND PLAN  Atrial fibrillation -    The patient  tolerates this rhythm and rate control and anticoagulation. We will continue with the meds as listed.  We discussed NOACs and she does not wish to switch.  CAROTID ARTERY DISEASE -  This is mild and was evaluated earlier this year and will be checked again next year.  CORONARY ARTERY DISEASE -  She has had no new symptoms since a stress test earlier this year was unremarkable. No further evaluation is indicated. No change in therapy is indicated. She will continue with risk reduction.

## 2012-12-14 NOTE — Patient Instructions (Addendum)
The current medical regimen is effective;  continue present plan and medications.  Follow up in 6 months with Dr Hochrein.  You will receive a letter in the mail 2 months before you are due.  Please call us when you receive this letter to schedule your follow up appointment.  

## 2012-12-28 ENCOUNTER — Ambulatory Visit (INDEPENDENT_AMBULATORY_CARE_PROVIDER_SITE_OTHER): Payer: Medicare Other | Admitting: Internal Medicine

## 2012-12-28 ENCOUNTER — Encounter: Payer: Self-pay | Admitting: Internal Medicine

## 2012-12-28 VITALS — BP 130/76 | HR 56 | Temp 96.8°F | Resp 16 | Wt 132.0 lb

## 2012-12-28 DIAGNOSIS — I2510 Atherosclerotic heart disease of native coronary artery without angina pectoris: Secondary | ICD-10-CM

## 2012-12-28 DIAGNOSIS — Z23 Encounter for immunization: Secondary | ICD-10-CM

## 2012-12-28 DIAGNOSIS — R413 Other amnesia: Secondary | ICD-10-CM

## 2012-12-28 DIAGNOSIS — E039 Hypothyroidism, unspecified: Secondary | ICD-10-CM

## 2012-12-28 DIAGNOSIS — M810 Age-related osteoporosis without current pathological fracture: Secondary | ICD-10-CM

## 2012-12-28 DIAGNOSIS — E119 Type 2 diabetes mellitus without complications: Secondary | ICD-10-CM

## 2012-12-28 NOTE — Assessment & Plan Note (Signed)
Doing well 

## 2012-12-28 NOTE — Assessment & Plan Note (Signed)
Chronic. 

## 2012-12-28 NOTE — Assessment & Plan Note (Signed)
Continue with current prescription therapy as reflected on the Med list.  

## 2012-12-28 NOTE — Progress Notes (Signed)
   Subjective:     HPI  The patient presents for a follow-up of  chronic hypertension, A. Fib, chronic dyslipidemia, type 2 diabetes controlled with medicines. C/o memory issues  BP Readings from Last 3 Encounters:  12/28/12 130/76  12/14/12 132/78  09/28/12 120/78   Wt Readings from Last 3 Encounters:  12/28/12 132 lb (59.875 kg)  12/14/12 143 lb (64.864 kg)  09/28/12 142 lb (64.411 kg)       Review of Systems  Constitutional: Negative for chills, activity change, appetite change, fatigue and unexpected weight change.  HENT: Negative for congestion, mouth sores and sinus pressure.   Eyes: Negative for visual disturbance.  Respiratory: Negative for cough and chest tightness.   Gastrointestinal: Negative for nausea and abdominal pain.  Genitourinary: Negative for frequency, difficulty urinating and vaginal pain.  Musculoskeletal: Negative for back pain and gait problem.  Skin: Negative for pallor and rash.  Neurological: Negative for dizziness, tremors, weakness, numbness and headaches.  Psychiatric/Behavioral: Negative for suicidal ideas, confusion and sleep disturbance.       Objective:   Physical Exam  Constitutional: She appears well-developed and well-nourished. No distress.  HENT:  Head: Normocephalic.  Right Ear: External ear normal.  Left Ear: External ear normal.  Nose: Nose normal.  Mouth/Throat: Oropharynx is clear and moist.  Eyes: Conjunctivae are normal. Pupils are equal, round, and reactive to light. Right eye exhibits no discharge. Left eye exhibits no discharge.  Neck: Normal range of motion. Neck supple. No JVD present. No tracheal deviation present. No thyromegaly present.  Cardiovascular: Normal rate, regular rhythm and normal heart sounds.   Pulmonary/Chest: No stridor. No respiratory distress. She has no wheezes.  Abdominal: Soft. Bowel sounds are normal. She exhibits no distension and no mass. There is no tenderness. There is no rebound and no  guarding.  Musculoskeletal: She exhibits no edema and no tenderness.  Lymphadenopathy:    She has no cervical adenopathy.  Neurological: She displays normal reflexes. No cranial nerve deficit. She exhibits normal muscle tone. Coordination normal.  Skin: No rash noted. No erythema.  Psychiatric: She has a normal mood and affect. Her behavior is normal. Judgment and thought content normal.     Lab Results  Component Value Date   WBC 7.0 03/26/2012   HGB 15.3* 03/26/2012   HCT 42.9 03/26/2012   PLT 214.0 03/26/2012   GLUCOSE 111* 09/28/2012   CHOL 170 09/16/2011   TRIG 156.0* 09/16/2011   HDL 63.20 09/16/2011   LDLDIRECT 100.5 09/18/2009   LDLCALC 76 09/16/2011   ALT 21 03/26/2012   AST 36 03/26/2012   NA 142 09/28/2012   K 5.0 09/28/2012   CL 104 09/28/2012   CREATININE 1.4* 09/28/2012   BUN 25* 09/28/2012   CO2 31 09/28/2012   TSH 2.12 09/28/2012   INR 2.2 11/19/2012   HGBA1C 6.6* 09/28/2012        Assessment & Plan:

## 2012-12-28 NOTE — Assessment & Plan Note (Signed)
[name] stopped Aricept - ?dreams. OK to re-start: options discussed.

## 2012-12-30 ENCOUNTER — Ambulatory Visit (INDEPENDENT_AMBULATORY_CARE_PROVIDER_SITE_OTHER): Payer: Medicare Other | Admitting: Pharmacist

## 2012-12-30 DIAGNOSIS — I4891 Unspecified atrial fibrillation: Secondary | ICD-10-CM

## 2012-12-30 DIAGNOSIS — Z7901 Long term (current) use of anticoagulants: Secondary | ICD-10-CM

## 2012-12-31 ENCOUNTER — Telehealth: Payer: Self-pay | Admitting: *Deleted

## 2012-12-31 MED ORDER — DONEPEZIL HCL 5 MG PO TABS: 5.0000 mg | ORAL_TABLET | Freq: Every evening | ORAL | Status: DC | PRN

## 2012-12-31 NOTE — Telephone Encounter (Signed)
Pt called requesting an Aricept refill.  Medication not on med list.  Please advise

## 2012-12-31 NOTE — Telephone Encounter (Signed)
Ok 5 mg/d - done [employer]

## 2013-01-03 NOTE — Telephone Encounter (Signed)
Pt informed rx sent.

## 2013-01-13 ENCOUNTER — Other Ambulatory Visit: Payer: Self-pay | Admitting: Internal Medicine

## 2013-02-04 ENCOUNTER — Other Ambulatory Visit: Payer: Self-pay

## 2013-02-04 MED ORDER — NITROGLYCERIN 0.4 MG SL SUBL: 0.4000 mg | SUBLINGUAL_TABLET | SUBLINGUAL | Status: AC | PRN

## 2013-02-09 ENCOUNTER — Other Ambulatory Visit: Payer: Self-pay | Admitting: Internal Medicine

## 2013-02-10 ENCOUNTER — Ambulatory Visit (INDEPENDENT_AMBULATORY_CARE_PROVIDER_SITE_OTHER): Payer: Medicare Other | Admitting: General Practice

## 2013-02-10 DIAGNOSIS — Z7901 Long term (current) use of anticoagulants: Secondary | ICD-10-CM

## 2013-02-10 DIAGNOSIS — I4891 Unspecified atrial fibrillation: Secondary | ICD-10-CM

## 2013-02-10 LAB — POCT INR: INR: 2.1

## 2013-03-05 ENCOUNTER — Other Ambulatory Visit: Payer: Self-pay | Admitting: Internal Medicine

## 2013-03-09 ENCOUNTER — Ambulatory Visit (INDEPENDENT_AMBULATORY_CARE_PROVIDER_SITE_OTHER): Payer: Medicare Other | Admitting: Pharmacist

## 2013-03-09 DIAGNOSIS — I4891 Unspecified atrial fibrillation: Secondary | ICD-10-CM

## 2013-03-09 DIAGNOSIS — Z7901 Long term (current) use of anticoagulants: Secondary | ICD-10-CM

## 2013-03-09 LAB — POCT INR: INR: 2.5

## 2013-03-22 ENCOUNTER — Telehealth: Payer: Self-pay | Admitting: Cardiology

## 2013-03-22 ENCOUNTER — Telehealth: Payer: Self-pay

## 2013-03-22 NOTE — Telephone Encounter (Signed)
Per sister - she reports pt has "really gone down hill fast".  Pt's "weight is down, she is not eating and seems depressed and can hardly get about".  States pt reports waking in the night a few nights ago and her gown being wet.  She doesn't know what from.  Sister is concerned that something is going on with her DM or thyroid.  Advised to call PCP for evaluation.  She states understanding.

## 2013-03-22 NOTE — Telephone Encounter (Signed)
New message  Patients sister is calling, patient is having chest pain. Patient is having sweats at night. Sister is very concerned. Patients sister would like you to please call and advise.

## 2013-03-22 NOTE — Telephone Encounter (Signed)
The patient's sister called concerned about the pt. She states she is weak and has had weight loss. She has made an ov for Friday, but is hoping to get labs done ahead of time so they can be reviewed during her visit.  I advised the sister this was no longer what is done, but she insisted we reach to double check with Dr.Plotnikov.   Sister's callback number - [phone] [facility])

## 2013-03-23 NOTE — Telephone Encounter (Signed)
Spoke to the patient  She is aware

## 2013-03-23 NOTE — Telephone Encounter (Signed)
It is best for me to see her first Thx

## 2013-03-25 ENCOUNTER — Encounter: Payer: Self-pay | Admitting: Internal Medicine

## 2013-03-25 ENCOUNTER — Ambulatory Visit (INDEPENDENT_AMBULATORY_CARE_PROVIDER_SITE_OTHER): Payer: Medicare Other | Admitting: Internal Medicine

## 2013-03-25 ENCOUNTER — Ambulatory Visit (INDEPENDENT_AMBULATORY_CARE_PROVIDER_SITE_OTHER): Payer: Medicare Other

## 2013-03-25 VITALS — BP 130/86 | HR 80 | Temp 98.5°F | Resp 16 | Wt 145.8 lb

## 2013-03-25 DIAGNOSIS — R202 Paresthesia of skin: Secondary | ICD-10-CM | POA: Insufficient documentation

## 2013-03-25 DIAGNOSIS — R413 Other amnesia: Secondary | ICD-10-CM

## 2013-03-25 DIAGNOSIS — R209 Unspecified disturbances of skin sensation: Secondary | ICD-10-CM

## 2013-03-25 DIAGNOSIS — R634 Abnormal weight loss: Secondary | ICD-10-CM

## 2013-03-25 DIAGNOSIS — M2550 Pain in unspecified joint: Secondary | ICD-10-CM

## 2013-03-25 LAB — CBC WITH DIFFERENTIAL/PLATELET
Basophils Relative: 0.2 % (ref 0.0–3.0)
Eosinophils Absolute: 0.2 10*3/uL (ref 0.0–0.7)
HCT: 43.1 % (ref 36.0–46.0)
Hemoglobin: 14.7 g/dL (ref 12.0–15.0)
Lymphs Abs: 1 10*3/uL (ref 0.7–4.0)
MCHC: 34.1 g/dL (ref 30.0–36.0)
MCV: 95.9 fl (ref 78.0–100.0)
Monocytes Absolute: 0.7 10*3/uL (ref 0.1–1.0)
Monocytes Relative: 4.9 % (ref 3.0–12.0)
Neutro Abs: 13.1 10*3/uL — ABNORMAL HIGH (ref 1.4–7.7)
Neutrophils Relative %: 87 % — ABNORMAL HIGH (ref 43.0–77.0)
RBC: 4.49 Mil/uL (ref 3.87–5.11)

## 2013-03-25 LAB — SEDIMENTATION RATE: Sed Rate: 82 mm/hr — ABNORMAL HIGH (ref 0–22)

## 2013-03-25 LAB — HEPATIC FUNCTION PANEL
ALT: 26 U/L (ref 0–35)
AST: 43 U/L — ABNORMAL HIGH (ref 0–37)
Albumin: 3.6 g/dL (ref 3.5–5.2)
Bilirubin, Direct: 0.2 mg/dL (ref 0.0–0.3)
Total Bilirubin: 0.7 mg/dL (ref 0.3–1.2)
Total Protein: 8.1 g/dL (ref 6.0–8.3)

## 2013-03-25 LAB — BASIC METABOLIC PANEL
BUN: 21 mg/dL (ref 6–23)
CO2: 26 mEq/L (ref 19–32)
Chloride: 98 mEq/L (ref 96–112)
Creatinine, Ser: 1.2 mg/dL (ref 0.4–1.2)
GFR: 46.58 mL/min — ABNORMAL LOW (ref >60.00)
Potassium: 4.5 mEq/L (ref 3.5–5.1)

## 2013-03-25 LAB — RHEUMATOID FACTOR: Rhuematoid fact SerPl-aCnc: 132 IU/mL — ABNORMAL HIGH (ref <=14)

## 2013-03-25 MED ORDER — PREDNISONE 10 MG PO TABS: ORAL_TABLET | ORAL | Status: DC

## 2013-03-25 NOTE — Assessment & Plan Note (Signed)
Not taking Aricept yet

## 2013-03-25 NOTE — Progress Notes (Signed)
Patient ID: [name], female   DOB: March 22, 1921, 79 y.o.   MRN: [id number]   Subjective:     HPI  C/o pains all over and stiffness x2-3 mo, getting worse  C/o wt loss and depression   The patient presents for a follow-up of  chronic hypertension, A. Fib, chronic dyslipidemia, type 2 diabetes controlled with medicines. C/o memory issues  BP Readings from Last 3 Encounters:  03/25/13 130/86  12/28/12 130/76  12/14/12 132/78   Wt Readings from Last 3 Encounters:  03/25/13 145 lb 12 oz (66.112 kg)  12/28/12 132 lb (59.875 kg)  12/14/12 143 lb (64.864 kg)       Review of Systems  Constitutional: Negative for chills, activity change, appetite change, fatigue and unexpected weight change.  HENT: Negative for congestion, mouth sores and sinus pressure.   Eyes: Negative for visual disturbance.  Respiratory: Negative for cough and chest tightness.   Gastrointestinal: Negative for nausea and abdominal pain.  Genitourinary: Negative for frequency, difficulty urinating and vaginal pain.  Musculoskeletal: Negative for back pain and gait problem.  Skin: Negative for pallor and rash.  Neurological: Negative for dizziness, tremors, weakness, numbness and headaches.  Psychiatric/Behavioral: Negative for suicidal ideas, confusion and sleep disturbance.       Objective:   Physical Exam  Constitutional: She appears well-developed and well-nourished. No distress.  HENT:  Head: Normocephalic.  Right Ear: External ear normal.  Left Ear: External ear normal.  Nose: Nose normal.  Mouth/Throat: Oropharynx is clear and moist.  Eyes: Conjunctivae are normal. Pupils are equal, round, and reactive to light. Right eye exhibits no discharge. Left eye exhibits no discharge.  Neck: Normal range of motion. Neck supple. No JVD present. No tracheal deviation present. No thyromegaly present.  Cardiovascular: Normal rate, regular rhythm and normal heart sounds.   Pulmonary/Chest: No stridor. No  respiratory distress. She has no wheezes.  Abdominal: Soft. Bowel sounds are normal. She exhibits no distension and no mass. There is no tenderness. There is no rebound and no guarding.  Musculoskeletal: She exhibits no edema and no tenderness.  Lymphadenopathy:    She has no cervical adenopathy.  Neurological: She displays normal reflexes. No cranial nerve deficit. She exhibits normal muscle tone. Coordination normal.  Skin: No rash noted. No erythema.  Psychiatric: She has a normal mood and affect. Her behavior is normal. Judgment and thought content normal.     Lab Results  Component Value Date   WBC 7.0 03/26/2012   HGB 15.3* 03/26/2012   HCT 42.9 03/26/2012   PLT 214.0 03/26/2012   GLUCOSE 111* 09/28/2012   CHOL 170 09/16/2011   TRIG 156.0* 09/16/2011   HDL 63.20 09/16/2011   LDLDIRECT 100.5 09/18/2009   LDLCALC 76 09/16/2011   ALT 21 03/26/2012   AST 36 03/26/2012   NA 142 09/28/2012   K 5.0 09/28/2012   CL 104 09/28/2012   CREATININE 1.4* 09/28/2012   BUN 25* 09/28/2012   CO2 31 09/28/2012   TSH 2.12 09/28/2012   INR 2.5 03/09/2013   HGBA1C 6.6* 09/28/2012        Assessment & Plan:

## 2013-03-25 NOTE — Patient Instructions (Addendum)
Probable side effects of Crestor vs "PMR - Polymyalgia Rheumatica" Stop Crestor!  Start Prednisone in 1 week if not better

## 2013-03-25 NOTE — Assessment & Plan Note (Signed)
Labs

## 2013-03-25 NOTE — Progress Notes (Signed)
Pre visit review using our clinic review tool, if applicable. No additional management support is needed unless otherwise documented below in the visit note. 

## 2013-03-25 NOTE — Assessment & Plan Note (Addendum)
Probable side effects of Crestor vs "PMR - Polymyalgia Rheumatica" vs other Stop Crestor!  Start Prednisone in 1 week if not better

## 2013-03-25 NOTE — Assessment & Plan Note (Signed)
Probable side effects of Crestor vs "PMR - Polymyalgia Rheumatica" Stop Crestor!  Start Prednisone in 1 week if not better 

## 2013-03-28 ENCOUNTER — Telehealth: Payer: Self-pay | Admitting: Cardiology

## 2013-03-28 LAB — URINALYSIS, ROUTINE W REFLEX MICROSCOPIC
Bilirubin Urine: NEGATIVE
Ketones, ur: NEGATIVE
Leukocytes, UA: NEGATIVE
Nitrite: POSITIVE
Specific Gravity, Urine: 1.025 (ref 1.000–1.030)
pH: 5.5 (ref 5.0–8.0)

## 2013-03-28 LAB — VITAMIN B12: Vitamin B-12: 1214 pg/mL — ABNORMAL HIGH (ref 211–911)

## 2013-03-28 NOTE — Telephone Encounter (Signed)
I would be happy to order home health.

## 2013-03-28 NOTE — Telephone Encounter (Signed)
Spoke with patient who c/o could not rest well last night, weakness in bilateral legs.  Patient states she was up and down all night going to the bathroom a couple of times and could not get comfortable.  Patient states her sister brought her a walker and this has helped her - states her legs were so weak last night that she couldn't get off the commode.  Patient was advised by Dr. Posey Rea on Friday to stop Crestor due to arthralgia.  Patient states she may be some better since stopping Crestor; states the walker has helped her more than anything.  Patient denies edema in lower extremities, states she has been keeping them elevated and there is no swelling presently.  Patient denies SOB, weight gain and chest discomfort.  Patient states she has family in the home but that she could probably use some additional help from home health agency occasionally as her brother and sister are not able to do all that she needs help with.  I advised patient that I will send message to Drs. Hochrein and Plotnikov for advice regarding help in the home.  Patient is in agreement with this plan.  Patient states she does not feel that she needs a cardiology appointment presently and was advised to call the office with symptoms of chest discomfort, SOB, edema in extremities or other symptoms she feels are heart related.  Patient verbalized understanding.

## 2013-03-28 NOTE — Telephone Encounter (Signed)
New Message  Pt called states that over the weekend her feet began to swell and are in a worsen state// Feet are elevated and stilll swollen// Has taken the lasix with no results///S. O.B// unable to walk// Please call back

## 2013-03-29 ENCOUNTER — Telehealth: Payer: Self-pay

## 2013-03-29 LAB — PROTEIN ELECTROPHORESIS, SERUM
Alpha-2-Globulin: 16.5 % — ABNORMAL HIGH (ref 7.1–11.8)
Gamma Globulin: 16.8 % (ref 11.1–18.8)
Total Protein, Serum Electrophoresis: 6.5 g/dL (ref 6.0–8.3)

## 2013-03-29 NOTE — Telephone Encounter (Signed)
Patient sister called [facility] stating that they stopped by yesterday for lab collection and would like to know results. Sister also wanted MD to know that she ran a fever of 100.4 this am and thinks that she may have kidney infection.

## 2013-03-30 MED ORDER — CEFUROXIME AXETIL 250 MG PO TABS: 250.0000 mg | ORAL_TABLET | Freq: Two times a day (BID) | ORAL | Status: DC

## 2013-03-30 NOTE — Telephone Encounter (Signed)
Doubt UTI, however I will send an abx in (UA was ok). Pls start Prednisone if not better Thx

## 2013-03-31 ENCOUNTER — Telehealth: Payer: Self-pay | Admitting: Internal Medicine

## 2013-03-31 NOTE — Telephone Encounter (Signed)
Pt informed of MDs advisement see phone note dated 03/29/13. Pt is going to start ceftin and prednisone and she how she feels. She states her fever is gone. I advised today is MDs half day and to call us back if she isn't feeling better after starting meds. Pt understands.

## 2013-03-31 NOTE — Telephone Encounter (Signed)
Pt's niece called to request a work in for [name].  She has a fever, not eating, can't control bladder, can not get around very well.  She stays in the bed.  She fell twice and EMT's had to come to her up.  They don't know what to do for her.

## 2013-04-04 ENCOUNTER — Ambulatory Visit: Payer: Medicare Other | Admitting: Internal Medicine

## 2013-04-05 NOTE — Telephone Encounter (Signed)
Per sister call - seems very anxious about pt and her well being.  States she is very weak and going down hill very quickly.  States she recently saw Dr Posey Rea who started her on Prednisone.  Sister concerned about pt's ankle edema that comes and goes.  She report pt sleeps at night with feet elevated but this does not always help.  Advised she can on occasion take extra Furosemide however she would need to call first due to pt kidney function being elevated.  Pt has a follow up appt with Dr Posey Rea the 12/29 and they will keep that.  I scheduled her to see Dr Antoine Poche 1/9.  She will call back before then if further problems or concerns.

## 2013-04-05 NOTE — Telephone Encounter (Signed)
New message  Pt sister called.. States she was advised to call PCP-- they went to see PCP.Marland Kitchen However her legs and arms are swellings really bad.. With no strength in her arms for use.Marland Kitchen Has taken lasix// Requests a call back to discuss if it is a heart matter. Please call

## 2013-04-08 ENCOUNTER — Other Ambulatory Visit: Payer: Self-pay | Admitting: Internal Medicine

## 2013-04-08 NOTE — Telephone Encounter (Signed)
Refill done.  

## 2013-04-11 ENCOUNTER — Encounter: Payer: Self-pay | Admitting: Internal Medicine

## 2013-04-11 ENCOUNTER — Ambulatory Visit (INDEPENDENT_AMBULATORY_CARE_PROVIDER_SITE_OTHER)
Admission: RE | Admit: 2013-04-11 | Discharge: 2013-04-11 | Disposition: A | Discharge status: Home / Self Care | Payer: Medicare Other | Source: Ambulatory Visit | Attending: Internal Medicine | Admitting: Internal Medicine

## 2013-04-11 ENCOUNTER — Ambulatory Visit (INDEPENDENT_AMBULATORY_CARE_PROVIDER_SITE_OTHER): Payer: Medicare Other | Admitting: Internal Medicine

## 2013-04-11 VITALS — BP 114/80 | HR 91 | Temp 97.3°F | Resp 16 | Wt 142.5 lb

## 2013-04-11 DIAGNOSIS — E039 Hypothyroidism, unspecified: Secondary | ICD-10-CM

## 2013-04-11 DIAGNOSIS — I10 Essential (primary) hypertension: Secondary | ICD-10-CM

## 2013-04-11 DIAGNOSIS — I4891 Unspecified atrial fibrillation: Secondary | ICD-10-CM

## 2013-04-11 DIAGNOSIS — E119 Type 2 diabetes mellitus without complications: Secondary | ICD-10-CM

## 2013-04-11 DIAGNOSIS — M069 Rheumatoid arthritis, unspecified: Secondary | ICD-10-CM

## 2013-04-11 DIAGNOSIS — R609 Edema, unspecified: Secondary | ICD-10-CM

## 2013-04-11 DIAGNOSIS — R634 Abnormal weight loss: Secondary | ICD-10-CM

## 2013-04-11 DIAGNOSIS — M2550 Pain in unspecified joint: Secondary | ICD-10-CM

## 2013-04-11 MED ORDER — PREDNISONE 10 MG PO TABS: ORAL_TABLET | ORAL | Status: DC

## 2013-04-11 MED ORDER — TORSEMIDE 10 MG PO TABS: 10.0000 mg | ORAL_TABLET | Freq: Every day | ORAL | Status: DC

## 2013-04-11 MED ORDER — METHYLPREDNISOLONE ACETATE 80 MG/ML IJ SUSP
80.0000 mg | Freq: Once | INTRAMUSCULAR | Status: AC
  Administered 2013-04-11: 80 mg via INTRAMUSCULAR

## 2013-04-11 MED ORDER — TRAMADOL HCL 50 MG PO TABS: 25.0000 mg | ORAL_TABLET | Freq: Two times a day (BID) | ORAL | Status: AC | PRN

## 2013-04-11 NOTE — Assessment & Plan Note (Signed)
Depomedrol 80 mg im RTC 1 wk

## 2013-04-11 NOTE — Assessment & Plan Note (Signed)
Changed to Demadex May need O2

## 2013-04-11 NOTE — Assessment & Plan Note (Signed)
Continue with current prescription therapy as reflected on the Med list.  

## 2013-04-11 NOTE — Assessment & Plan Note (Signed)
CXR Labs 

## 2013-04-11 NOTE — Progress Notes (Signed)
Pre visit review using our clinic review tool, if applicable. No additional management support is needed unless otherwise documented below in the visit note. 

## 2013-04-11 NOTE — Progress Notes (Signed)
   Subjective:     HPI  F/u pains all over and stiffness x 3+ mo, not better with d/c'ing Crestor and w/adding steroids  C/o wt loss and depression. C/o edema - worse   The patient presents for a follow-up of  chronic hypertension, A. Fib, chronic dyslipidemia, type 2 diabetes controlled with medicines. C/o memory issues  BP Readings from Last 3 Encounters:  04/11/13 114/80  03/25/13 130/86  12/28/12 130/76   Wt Readings from Last 3 Encounters:  04/11/13 142 lb 8 oz (64.638 kg)  03/25/13 145 lb 12 oz (66.112 kg)  12/28/12 132 lb (59.875 kg)       Review of Systems  Constitutional: Positive for chills, activity change, appetite change, fatigue and unexpected weight change.  HENT: Negative for congestion, mouth sores, sinus pressure, trouble swallowing and voice change.   Eyes: Negative for visual disturbance.  Respiratory: Positive for shortness of breath. Negative for cough, chest tightness and wheezing.   Cardiovascular: Positive for leg swelling.  Gastrointestinal: Negative for nausea, abdominal pain and diarrhea.  Genitourinary: Negative for frequency, difficulty urinating and vaginal pain.  Musculoskeletal: Negative for back pain and gait problem.  Skin: Negative for pallor and rash.  Neurological: Positive for dizziness, weakness and light-headedness. Negative for tremors, numbness and headaches.  Psychiatric/Behavioral: Positive for confusion. Negative for suicidal ideas and sleep disturbance. The patient is not nervous/anxious.        Objective:   Physical Exam  Constitutional: She appears well-developed. No distress.  Chronically ill appearing  HENT:  Head: Normocephalic.  Right Ear: External ear normal.  Left Ear: External ear normal.  Nose: Nose normal.  Mouth/Throat: Oropharynx is clear and moist.  Eyes: Conjunctivae are normal. Pupils are equal, round, and reactive to light. Right eye exhibits no discharge. Left eye exhibits no discharge.  Neck: Normal  range of motion. Neck supple. No JVD present. No tracheal deviation present. No thyromegaly present.  Cardiovascular: Normal rate.   Murmur heard. irreg irreg  Pulmonary/Chest: No stridor. No respiratory distress. She has no wheezes.  Abdominal: Soft. Bowel sounds are normal. She exhibits no distension and no mass. There is no tenderness. There is no rebound and no guarding.  Genitourinary: Guaiac negative stool.  Musculoskeletal: She exhibits no edema and no tenderness.  Lymphadenopathy:    She has no cervical adenopathy.  Neurological: She displays normal reflexes. No cranial nerve deficit. She exhibits normal muscle tone. Coordination normal.  Skin: No rash noted. No erythema.  Psychiatric: She has a normal mood and affect. Her behavior is normal. Judgment and thought content normal.  Ataxic, weak   Lab Results  Component Value Date   WBC 15.1* 03/25/2013   HGB 14.7 03/25/2013   HCT 43.1 03/25/2013   PLT 360.0 03/25/2013   GLUCOSE 143* 03/25/2013   CHOL 170 09/16/2011   TRIG 156.0* 09/16/2011   HDL 63.20 09/16/2011   LDLDIRECT 100.5 09/18/2009   LDLCALC 76 09/16/2011   ALT 26 03/25/2013   AST 43* 03/25/2013   NA 132* 03/25/2013   K 4.5 03/25/2013   CL 98 03/25/2013   CREATININE 1.2 03/25/2013   BUN 21 03/25/2013   CO2 26 03/25/2013   TSH 2.83 03/25/2013   INR 2.5 03/09/2013   HGBA1C 6.6* 09/28/2012        Assessment & Plan:

## 2013-04-12 ENCOUNTER — Encounter: Payer: Self-pay | Admitting: Internal Medicine

## 2013-04-12 NOTE — Assessment & Plan Note (Signed)
Continue with current prescription therapy as reflected on the Med list.  

## 2013-04-12 NOTE — Assessment & Plan Note (Signed)
Will increase steroids CXR

## 2013-04-12 NOTE — Assessment & Plan Note (Signed)
Not on Rx 

## 2013-04-13 ENCOUNTER — Encounter: Payer: Self-pay | Admitting: *Deleted

## 2013-04-18 ENCOUNTER — Other Ambulatory Visit (INDEPENDENT_AMBULATORY_CARE_PROVIDER_SITE_OTHER): Payer: Medicare Other

## 2013-04-18 ENCOUNTER — Ambulatory Visit (INDEPENDENT_AMBULATORY_CARE_PROVIDER_SITE_OTHER): Payer: Medicare Other | Admitting: Internal Medicine

## 2013-04-18 ENCOUNTER — Encounter: Payer: Self-pay | Admitting: Internal Medicine

## 2013-04-18 VITALS — BP 138/90 | HR 80 | Temp 97.7°F | Resp 16 | Wt 144.0 lb

## 2013-04-18 DIAGNOSIS — M2550 Pain in unspecified joint: Secondary | ICD-10-CM

## 2013-04-18 DIAGNOSIS — I2510 Atherosclerotic heart disease of native coronary artery without angina pectoris: Secondary | ICD-10-CM

## 2013-04-18 DIAGNOSIS — M545 Low back pain, unspecified: Secondary | ICD-10-CM

## 2013-04-18 DIAGNOSIS — E785 Hyperlipidemia, unspecified: Secondary | ICD-10-CM

## 2013-04-18 LAB — BASIC METABOLIC PANEL
BUN: 18 mg/dL (ref 6–23)
CHLORIDE: 98 meq/L (ref 96–112)
CO2: 26 mEq/L (ref 19–32)
Calcium: 9.2 mg/dL (ref 8.4–10.5)
Creatinine, Ser: 1 mg/dL (ref 0.4–1.2)
GFR: 52.83 mL/min — ABNORMAL LOW (ref >60.00)
Glucose, Bld: 159 mg/dL — ABNORMAL HIGH (ref 70–99)
Potassium: 5 mEq/L (ref 3.5–5.1)
SODIUM: 132 meq/L — AB (ref 135–145)

## 2013-04-18 LAB — SEDIMENTATION RATE: SED RATE: 60 mm/h — AB (ref 0–22)

## 2013-04-18 MED ORDER — METHYLPREDNISOLONE ACETATE 80 MG/ML IJ SUSP
80.0000 mg | Freq: Once | INTRAMUSCULAR | Status: AC
  Administered 2013-04-18: 80 mg via INTRAMUSCULAR

## 2013-04-18 MED ORDER — PREDNISONE 10 MG PO TABS: 20.0000 mg | ORAL_TABLET | Freq: Every day | ORAL | Status: DC

## 2013-04-18 MED ORDER — TORSEMIDE 10 MG PO TABS: 20.0000 mg | ORAL_TABLET | Freq: Every day | ORAL | Status: AC

## 2013-04-18 NOTE — Progress Notes (Signed)
   Subjective:     HPI  F/u pains all over and stiffness x 3+ mo, not better with d/c'ing Crestor, w/adding steroids and with increasing steroid dose  C/o wt loss and depression. C/o edema - worse   The patient presents for a follow-up of  chronic hypertension, A. Fib, chronic dyslipidemia, type 2 diabetes controlled with medicines. C/o memory issues  BP Readings from Last 3 Encounters:  04/18/13 138/90  04/11/13 114/80  03/25/13 130/86   Wt Readings from Last 3 Encounters:  04/18/13 144 lb (65.318 kg)  04/11/13 142 lb 8 oz (64.638 kg)  03/25/13 145 lb 12 oz (66.112 kg)       Review of Systems  Constitutional: Positive for chills, activity change, appetite change, fatigue and unexpected weight change.  HENT: Negative for congestion, mouth sores, sinus pressure, trouble swallowing and voice change.   Eyes: Negative for visual disturbance.  Respiratory: Positive for shortness of breath. Negative for cough, chest tightness and wheezing.   Cardiovascular: Positive for leg swelling.  Gastrointestinal: Negative for nausea, abdominal pain and diarrhea.  Genitourinary: Negative for frequency, difficulty urinating and vaginal pain.  Musculoskeletal: Negative for back pain and gait problem.  Skin: Negative for pallor and rash.  Neurological: Positive for dizziness, weakness and light-headedness. Negative for tremors, numbness and headaches.  Psychiatric/Behavioral: Positive for confusion. Negative for suicidal ideas and sleep disturbance. The patient is not nervous/anxious.        Objective:   Physical Exam  Constitutional: She appears well-developed. No distress.  Chronically ill appearing  HENT:  Head: Normocephalic.  Right Ear: External ear normal.  Left Ear: External ear normal.  Nose: Nose normal.  Mouth/Throat: Oropharynx is clear and moist.  Eyes: Conjunctivae are normal. Pupils are equal, round, and reactive to light. Right eye exhibits no discharge. Left eye  exhibits no discharge.  Neck: Normal range of motion. Neck supple. No JVD present. No tracheal deviation present. No thyromegaly present.  Cardiovascular: Normal rate.   Murmur heard. irreg irreg  Pulmonary/Chest: No stridor. No respiratory distress. She has no wheezes.  Abdominal: Soft. Bowel sounds are normal. She exhibits no distension and no mass. There is no tenderness. There is no rebound and no guarding.  Genitourinary: Guaiac negative stool.  Musculoskeletal: She exhibits no edema and no tenderness.  Lymphadenopathy:    She has no cervical adenopathy.  Neurological: She displays normal reflexes. No cranial nerve deficit. She exhibits normal muscle tone. Coordination normal.  Skin: No rash noted. No erythema.  Psychiatric: She has a normal mood and affect. Her behavior is normal. Judgment and thought content normal.  Ataxic, weak   Lab Results  Component Value Date   WBC 15.1* 03/25/2013   HGB 14.7 03/25/2013   HCT 43.1 03/25/2013   PLT 360.0 03/25/2013   GLUCOSE 143* 03/25/2013   CHOL 170 09/16/2011   TRIG 156.0* 09/16/2011   HDL 63.20 09/16/2011   LDLDIRECT 100.5 09/18/2009   LDLCALC 76 09/16/2011   ALT 26 03/25/2013   AST 43* 03/25/2013   NA 132* 03/25/2013   K 4.5 03/25/2013   CL 98 03/25/2013   CREATININE 1.2 03/25/2013   BUN 21 03/25/2013   CO2 26 03/25/2013   TSH 2.83 03/25/2013   INR 2.5 03/09/2013   HGBA1C 6.6* 09/28/2012        Assessment & Plan:

## 2013-04-18 NOTE — Assessment & Plan Note (Signed)
Continue with current prescription therapy as reflected on the Med list.  

## 2013-04-18 NOTE — Assessment & Plan Note (Signed)
Off Crestor 

## 2013-04-18 NOTE — Assessment & Plan Note (Signed)
RA vs PMR Predn 20 mg/d. Tramadol prn Rheum ref

## 2013-04-18 NOTE — Progress Notes (Signed)
Pre visit review using our clinic review tool, if applicable. No additional management support is needed unless otherwise documented below in the visit note. 

## 2013-04-19 ENCOUNTER — Telehealth: Payer: Self-pay

## 2013-04-19 NOTE — Telephone Encounter (Signed)
The patient's sister called and is hoping to get clarification for the patient on what lasix medication she should be taking.   Thanks!

## 2013-04-19 NOTE — Telephone Encounter (Signed)
Please call Jerrye Beavers (sister) at [phone].

## 2013-04-19 NOTE — Telephone Encounter (Signed)
Torsemide (Demadex) 10 mg -- 2 tablets daily  Thx

## 2013-04-19 NOTE — Telephone Encounter (Signed)
Pt's sister informed.

## 2013-04-20 ENCOUNTER — Ambulatory Visit (INDEPENDENT_AMBULATORY_CARE_PROVIDER_SITE_OTHER): Payer: Medicare Other | Admitting: Pharmacist

## 2013-04-20 DIAGNOSIS — Z7901 Long term (current) use of anticoagulants: Secondary | ICD-10-CM

## 2013-04-20 DIAGNOSIS — I4891 Unspecified atrial fibrillation: Secondary | ICD-10-CM

## 2013-04-20 LAB — POCT INR: INR: 2.6

## 2013-04-21 ENCOUNTER — Ambulatory Visit (INDEPENDENT_AMBULATORY_CARE_PROVIDER_SITE_OTHER): Payer: Medicare Other | Admitting: Cardiology

## 2013-04-21 ENCOUNTER — Encounter: Payer: Self-pay | Admitting: Cardiology

## 2013-04-21 VITALS — BP 130/82 | HR 92 | Ht 65.5 in | Wt 139.0 lb

## 2013-04-21 DIAGNOSIS — I4891 Unspecified atrial fibrillation: Secondary | ICD-10-CM

## 2013-04-21 DIAGNOSIS — I2510 Atherosclerotic heart disease of native coronary artery without angina pectoris: Secondary | ICD-10-CM

## 2013-04-21 DIAGNOSIS — I10 Essential (primary) hypertension: Secondary | ICD-10-CM

## 2013-04-21 NOTE — Progress Notes (Signed)
HPI The patient presents for followup of coronary disease.  Since I last saw her she has had significant problems with weakness. She was taken off Crestor. She thinks this did help although her primary provider wasn't quite sure. She is being treated with steroids. She subsequently has had lower extremity swelling and has had to be switched from Lasix to [facility] and is currently taking twice the dose she started. This has helped her swelling. She does think off of the Crestor and with the steroids she has more strength. She was previously unable to even get up out of a chair. She's now walking the stairs. She denies any chest pressure or neck discomfort. She did have an episode of arm discomfort into her 1 sublingual nitroglycerin. She doesn't feel her palpitations and isn't having any presyncope or syncope. She's had no PND or orthopnea.  No Known Allergies  Current Outpatient Prescriptions  Medication Sig Dispense Refill  . b complex vitamins tablet Take 1 tablet by mouth daily.        . Cholecalciferol 1000 UNITS tablet Take 1,000 Units by mouth daily.        Marland Kitchen COUMADIN 5 MG tablet TAKE BY MOUTH AS DIRECTED  120 tablet  1  . diclofenac sodium (VOLTAREN) 1 % GEL Apply topically 2 (two) times daily as needed.        . isosorbide mononitrate (IMDUR) 30 MG 24 hr tablet TAKE ONE-HALF TABLET BY MOUTH EVERY DAY  30 tablet  5  . levothyroxine (SYNTHROID, LEVOTHROID) 50 MCG tablet TAKE 1 TABLET BY MOUTH EVERY DAY  90 tablet  1  . metoprolol (LOPRESSOR) 50 MG tablet TAKE 1 1/2 TABLETS BY MOUTH TWICE DAILY  90 tablet  0  . nitroGLYCERIN (NITROSTAT) 0.4 MG SL tablet Place 1 tablet (0.4 mg total) under the tongue every 5 (five) minutes as needed.  25 tablet  3  . potassium chloride SA (K-DUR,KLOR-CON) 20 MEQ tablet TAKE 1 TABLET BY MOUTH EVERY DAY  30 tablet  5  . predniSONE (DELTASONE) 10 MG tablet Take 2 tablets (20 mg total) by mouth daily with breakfast.  100 tablet  2  . torsemide (DEMADEX) 10 MG  tablet Take 2 tablets (20 mg total) by mouth daily.  60 tablet  5  . traMADol (ULTRAM) 50 MG tablet Take 0.5-1 tablets (25-50 mg total) by mouth every 12 (twelve) hours as needed for severe pain.  60 tablet  2  . vitamin C (ASCORBIC ACID) 500 MG tablet Take 500 mg by mouth daily.         No current facility-administered medications for this visit.    Past Medical History  Diagnosis Date  . Anemia   . Chronic diastolic heart failure     Dr Antoine Poche  . CAD (coronary artery disease)     s/p CABG; s/p PCI of CFX;  Last LHC 8/04: EF 75%, ostial LAD occluded, SVG-DX diffusely diseased in the proximal segment was ulcerated stenosis of up to 80% and occluded, circumflex occluded, SVG-OM 2 patent with 50% stenosis, distal circumflex angioplasty site 40%, RCA serial 60%.  PCI was elected and the patient underwent Taxus drug is eluting stenting of the SVG-Dx;  Nuc 2006 neg for isch  . Hyperlipidemia   . HTN (hypertension)     echo 4/06: EF 55-65%, mild MAC, mild BAE  . Osteoporosis   . Hip fracture, right   . LBP (low back pain)     Dr Prince Rome  . Atrial  fibrillation     coumadin  . Hypothyroidism   . Carotid stenosis     Dopplers 8/09:0-39% bilateral    Past Surgical History  Procedure Laterality Date  . Total hip arthroplasty      Right  . Cardiac surgery      triple bypass  . Abdominal hysterectomy    . Coronary angioplasty with stent placement    . Hemorrhoid surgery      ROS:  As stated in the HPI and negative for all other systems.  PHYSICAL EXAM BP 130/82  Pulse 92  Ht 5' 5.5" (1.664 m)  Wt 139 lb (63.05 kg)  BMI 22.77 kg/m2 GENERAL:  Frail appearing  NECK:  No jugular venous distention, waveform within normal limits, carotid upstroke brisk and symmetric, no bruits, no thyromegaly LYMPHATICS:  No cervical, inguinal adenopathy LUNGS:  Clear to auscultation bilaterally CHEST:  Well healed sternotomy scar. HEART:  PMI not displaced or sustained,S1 and S2 within normal  limits, no S3, no clicks, no rubs, no murmurs, irregular ABD:  Flat, positive bowel sounds normal in frequency in pitch, no bruits, no rebound, no guarding, no midline pulsatile mass, no hepatomegaly, no splenomegaly EXT:  2 plus pulses throughout, moderate ankle edema, no cyanosis no clubbing SKIN:  No rashes no nodules  EKG:  Atrial fibrillation, rate 92, axis within normal limits, intervals WNL, no acute ST-T wave changes.  04/21/2013   ASSESSMENT AND PLAN  Atrial fibrillation -  The patient  tolerates this rhythm and rate control and anticoagulation. We will continue with the meds as listed.  We discussed NOACs and she does not wish to switch.  CAROTID ARTERY DISEASE -  This is mild and was evaluated earlier this year and will be checked again in 2016.  CORONARY ARTERY DISEASE -  She had a stress test last year that was unremarkable. No further evaluation is indicated. No change in therapy is indicated. She will continue with risk reduction.

## 2013-04-21 NOTE — Patient Instructions (Signed)
The current medical regimen is effective;  continue present plan and medications.  Follow up in 1 year with Dr Hochrein.  You will receive a letter in the mail 2 months before you are due.  Please call us when you receive this letter to schedule your follow up appointment.  

## 2013-04-22 ENCOUNTER — Ambulatory Visit: Payer: Medicare Other | Admitting: Cardiology

## 2013-04-22 ENCOUNTER — Other Ambulatory Visit: Payer: Self-pay | Admitting: Internal Medicine

## 2013-04-25 ENCOUNTER — Ambulatory Visit (INDEPENDENT_AMBULATORY_CARE_PROVIDER_SITE_OTHER): Payer: Medicare Other | Admitting: Internal Medicine

## 2013-04-25 ENCOUNTER — Encounter: Payer: Self-pay | Admitting: Internal Medicine

## 2013-04-25 VITALS — BP 120/82 | HR 88 | Temp 98.1°F | Resp 16 | Wt 136.0 lb

## 2013-04-25 DIAGNOSIS — M2550 Pain in unspecified joint: Secondary | ICD-10-CM

## 2013-04-25 DIAGNOSIS — R609 Edema, unspecified: Secondary | ICD-10-CM

## 2013-04-25 DIAGNOSIS — I10 Essential (primary) hypertension: Secondary | ICD-10-CM

## 2013-04-25 DIAGNOSIS — M069 Rheumatoid arthritis, unspecified: Secondary | ICD-10-CM

## 2013-04-25 DIAGNOSIS — R413 Other amnesia: Secondary | ICD-10-CM

## 2013-04-25 DIAGNOSIS — D649 Anemia, unspecified: Secondary | ICD-10-CM

## 2013-04-25 DIAGNOSIS — E119 Type 2 diabetes mellitus without complications: Secondary | ICD-10-CM

## 2013-04-25 MED ORDER — METOPROLOL TARTRATE 50 MG PO TABS: ORAL_TABLET | ORAL | Status: DC

## 2013-04-25 MED ORDER — ISOSORBIDE MONONITRATE ER 30 MG PO TB24: ORAL_TABLET | ORAL | Status: DC

## 2013-04-25 NOTE — Progress Notes (Signed)
Pre visit review using our clinic review tool, if applicable. No additional management support is needed unless otherwise documented below in the visit note. 

## 2013-04-25 NOTE — Assessment & Plan Note (Signed)
Poss RA ?side effects of Crestor; r/o "PMR - Polymyalgia Rheumatica" vs RA  Potential benefits of a long term steroid  use as well as potential risks  and complications were explained to the patient and were aknowledged.

## 2013-04-25 NOTE — Assessment & Plan Note (Addendum)
Better  

## 2013-04-25 NOTE — Assessment & Plan Note (Signed)
Better  Continue with current prescription therapy (Demadex) as reflected on the Med list.

## 2013-04-25 NOTE — Assessment & Plan Note (Signed)
Cont Rx Better on higher dose steroids

## 2013-04-25 NOTE — Assessment & Plan Note (Signed)
Cont w/diet 

## 2013-04-25 NOTE — Assessment & Plan Note (Signed)
Continue with current prescription therapy as reflected on the Med list.  

## 2013-04-25 NOTE — Progress Notes (Signed)
   Subjective:     HPI  F/u pains all over (?RA) and stiffness x 3+ mo, not better with d/c'ing Crestor, better w/adding steroids and with increasing her steroid dose  F/u  wt loss and depression. F/u edema - better   The patient presents for a follow-up of  chronic hypertension, A. Fib, chronic dyslipidemia, type 2 diabetes controlled with medicines. F/u memory issues  BP Readings from Last 3 Encounters:  04/25/13 120/82  04/21/13 130/82  04/18/13 138/90   Wt Readings from Last 3 Encounters:  04/25/13 136 lb (61.689 kg)  04/21/13 139 lb (63.05 kg)  04/18/13 144 lb (65.318 kg)       Review of Systems  Constitutional: Positive for chills, activity change, appetite change, fatigue and unexpected weight change.  HENT: Negative for congestion, mouth sores, sinus pressure, trouble swallowing and voice change.   Eyes: Negative for visual disturbance.  Respiratory: Positive for shortness of breath. Negative for cough, chest tightness and wheezing.   Cardiovascular: Positive for leg swelling.  Gastrointestinal: Negative for nausea, abdominal pain and diarrhea.  Genitourinary: Negative for frequency, difficulty urinating and vaginal pain.  Musculoskeletal: Negative for back pain and gait problem.  Skin: Negative for pallor and rash.  Neurological: Positive for dizziness, weakness and light-headedness. Negative for tremors, numbness and headaches.  Psychiatric/Behavioral: Positive for confusion. Negative for suicidal ideas and sleep disturbance. The patient is not nervous/anxious.        Objective:   Physical Exam  Constitutional: She appears well-developed. No distress.  Chronically ill appearing  HENT:  Head: Normocephalic.  Right Ear: External ear normal.  Left Ear: External ear normal.  Nose: Nose normal.  Mouth/Throat: Oropharynx is clear and moist.  Eyes: Conjunctivae are normal. Pupils are equal, round, and reactive to light. Right eye exhibits no discharge. Left eye  exhibits no discharge.  Neck: Normal range of motion. Neck supple. No JVD present. No tracheal deviation present. No thyromegaly present.  Cardiovascular: Normal rate.   Murmur heard. irreg irreg  Pulmonary/Chest: No stridor. No respiratory distress. She has no wheezes.  Abdominal: Soft. Bowel sounds are normal. She exhibits no distension and no mass. There is no tenderness. There is no rebound and no guarding.  Genitourinary: Guaiac negative stool.  Musculoskeletal: She exhibits no edema and no tenderness.  Lymphadenopathy:    She has no cervical adenopathy.  Neurological: She displays normal reflexes. No cranial nerve deficit. She exhibits normal muscle tone. Coordination normal.  Skin: No rash noted. No erythema.  Psychiatric: She has a normal mood and affect. Her behavior is normal. Judgment and thought content normal.  Less ataxic, less weak   Lab Results  Component Value Date   WBC 15.1* 03/25/2013   HGB 14.7 03/25/2013   HCT 43.1 03/25/2013   PLT 360.0 03/25/2013   GLUCOSE 159* 04/18/2013   CHOL 170 09/16/2011   TRIG 156.0* 09/16/2011   HDL 63.20 09/16/2011   LDLDIRECT 100.5 09/18/2009   LDLCALC 76 09/16/2011   ALT 26 03/25/2013   AST 43* 03/25/2013   NA 132* 04/18/2013   K 5.0 04/18/2013   CL 98 04/18/2013   CREATININE 1.0 04/18/2013   BUN 18 04/18/2013   CO2 26 04/18/2013   TSH 2.83 03/25/2013   INR 2.6 04/20/2013   HGBA1C 6.6* 09/28/2012        Assessment & Plan:

## 2013-04-25 NOTE — Assessment & Plan Note (Signed)
Labs

## 2013-04-26 ENCOUNTER — Telehealth: Payer: Self-pay

## 2013-04-26 NOTE — Telephone Encounter (Signed)
Relevant patient education mailed to patient.  

## 2013-05-17 ENCOUNTER — Telehealth: Payer: Self-pay | Admitting: Internal Medicine

## 2013-05-17 NOTE — Telephone Encounter (Signed)
Relevant patient education assigned to patient using Emmi. ° °

## 2013-05-19 IMAGING — CR DG CHEST 2V
2 series · 2 of 2 positions shown · non-contrast
Comparison: 10/13/2006

CLINICAL DATA: Shortness of breath, pain

CHEST - 2 VIEW

[view not recorded (1 of 2)]
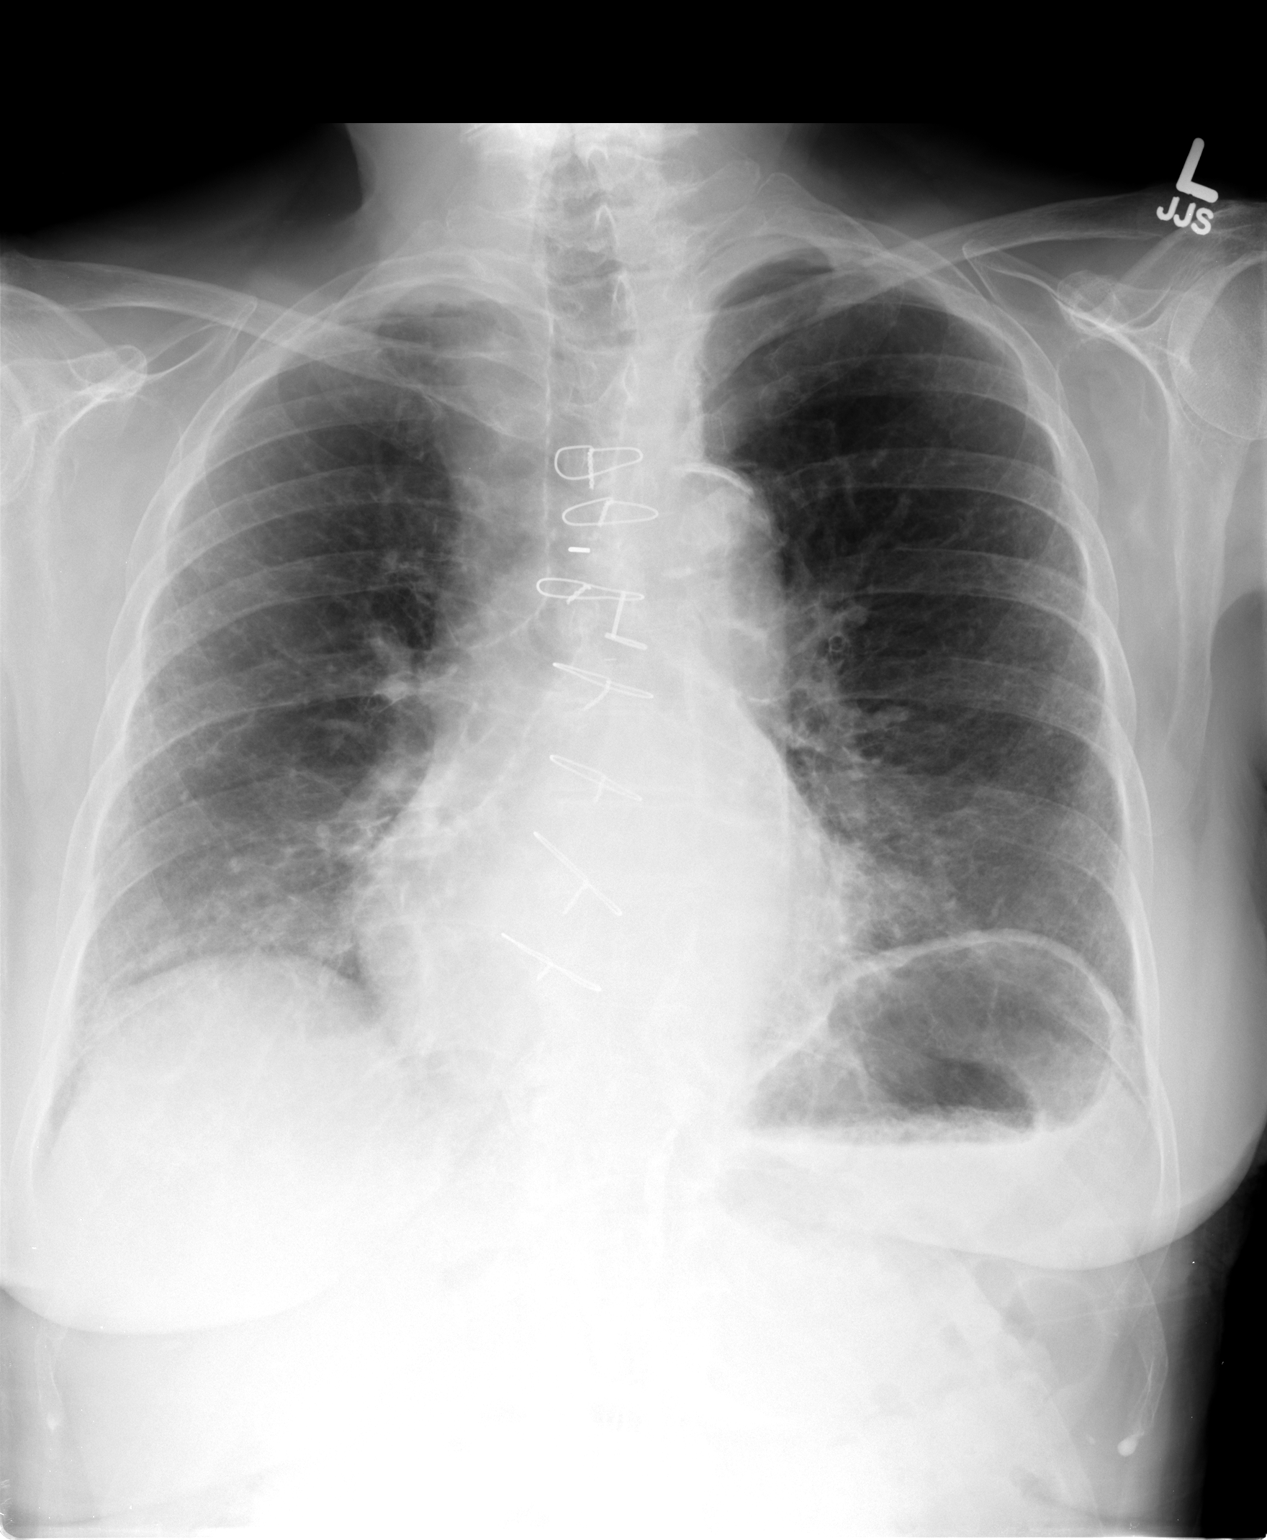

[view not recorded (2 of 2)]
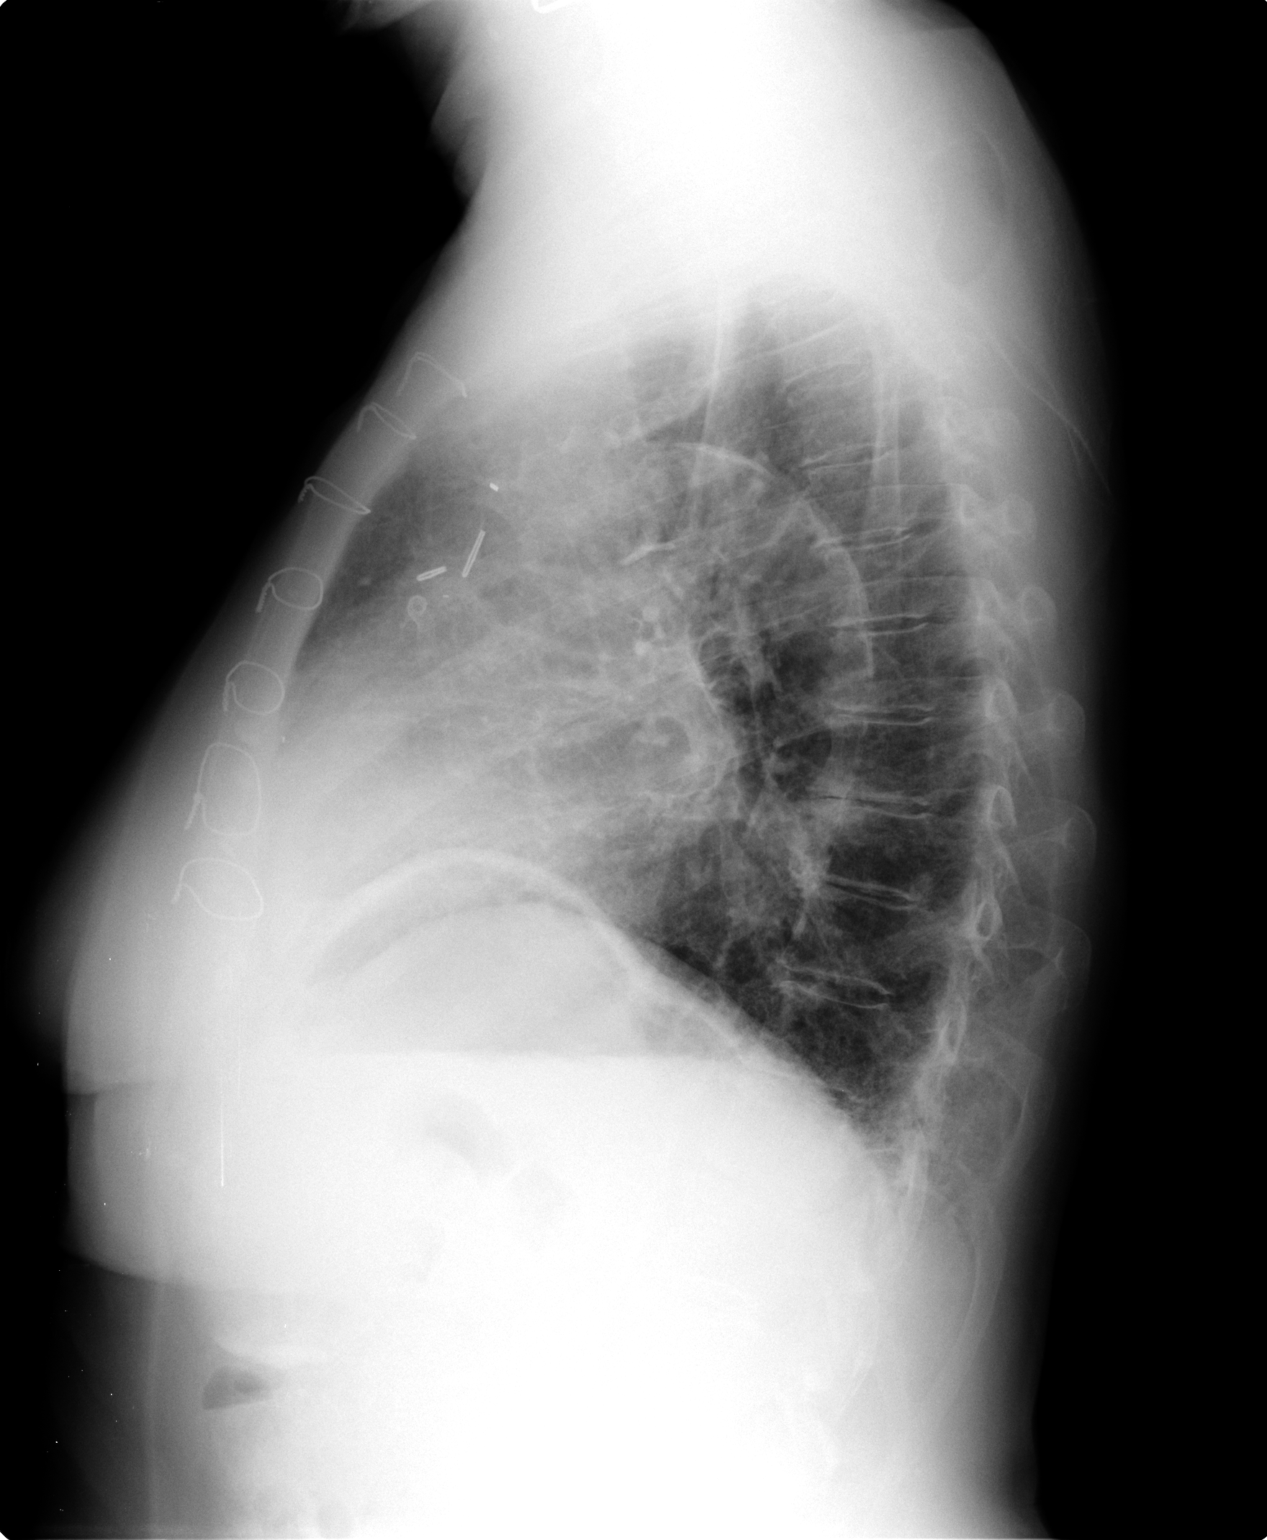

[2 of 2 positions shown; findings below may reference images not displayed]

FINDINGS: Cardiomediastinal silhouette is stable.  No acute
infiltrate or pulmonary edema.  Stable mild degenerative changes
thoracic spine.  Status post median sternotomy.  Probable chronic
mild interstitial prominence.  Question mild peripheral fibrotic
changes.  Mild basilar atelectasis or scarring.
IMPRESSION: No acute infiltrate or pulmonary edema.  Stable mild degenerative
changes thoracic spine.  Status post median sternotomy.  Probable
chronic mild interstitial prominence.  Question mild peripheral
fibrotic changes.  Mild basilar atelectasis or scarring.

## 2013-05-23 ENCOUNTER — Ambulatory Visit: Payer: Medicare Other | Admitting: Internal Medicine

## 2013-05-28 ENCOUNTER — Other Ambulatory Visit: Payer: Self-pay | Admitting: Internal Medicine

## 2013-06-02 ENCOUNTER — Other Ambulatory Visit: Payer: Self-pay | Admitting: Internal Medicine

## 2013-06-08 ENCOUNTER — Ambulatory Visit (INDEPENDENT_AMBULATORY_CARE_PROVIDER_SITE_OTHER): Payer: Medicare Other | Admitting: *Deleted

## 2013-06-08 DIAGNOSIS — Z7901 Long term (current) use of anticoagulants: Secondary | ICD-10-CM

## 2013-06-08 DIAGNOSIS — I4891 Unspecified atrial fibrillation: Secondary | ICD-10-CM

## 2013-06-08 LAB — POCT INR: INR: 2.4

## 2013-06-08 MED ORDER — WARFARIN SODIUM 5 MG PO TABS: ORAL_TABLET | ORAL | Status: DC

## 2013-06-14 ENCOUNTER — Ambulatory Visit: Payer: Medicare Other | Admitting: Cardiology

## 2013-06-17 ENCOUNTER — Other Ambulatory Visit: Payer: Self-pay

## 2013-06-17 MED ORDER — WARFARIN SODIUM 5 MG PO TABS: ORAL_TABLET | ORAL | Status: AC

## 2013-06-17 MED ORDER — ISOSORBIDE MONONITRATE ER 30 MG PO TB24: ORAL_TABLET | ORAL | Status: AC

## 2013-06-17 NOTE — Telephone Encounter (Signed)
Received fax from [facility] stating pt has transferred pharmacy and will need new rx for warfarin and isosorbide.

## 2013-06-28 ENCOUNTER — Ambulatory Visit: Payer: Medicare Other | Admitting: Internal Medicine

## 2013-06-29 ENCOUNTER — Ambulatory Visit (INDEPENDENT_AMBULATORY_CARE_PROVIDER_SITE_OTHER): Payer: Medicare Other | Admitting: *Deleted

## 2013-06-29 DIAGNOSIS — Z7901 Long term (current) use of anticoagulants: Secondary | ICD-10-CM

## 2013-06-29 DIAGNOSIS — I4891 Unspecified atrial fibrillation: Secondary | ICD-10-CM

## 2013-06-29 LAB — POCT INR: INR: 3

## 2013-07-07 ENCOUNTER — Ambulatory Visit: Payer: Medicare Other | Admitting: Internal Medicine

## 2013-07-11 ENCOUNTER — Other Ambulatory Visit (INDEPENDENT_AMBULATORY_CARE_PROVIDER_SITE_OTHER): Payer: Medicare Other

## 2013-07-11 ENCOUNTER — Ambulatory Visit (INDEPENDENT_AMBULATORY_CARE_PROVIDER_SITE_OTHER): Payer: Medicare Other | Admitting: Internal Medicine

## 2013-07-11 ENCOUNTER — Encounter: Payer: Self-pay | Admitting: Internal Medicine

## 2013-07-11 VITALS — BP 120/82 | HR 72 | Temp 97.5°F | Resp 16 | Wt 137.0 lb

## 2013-07-11 DIAGNOSIS — M2550 Pain in unspecified joint: Secondary | ICD-10-CM

## 2013-07-11 DIAGNOSIS — M545 Low back pain, unspecified: Secondary | ICD-10-CM

## 2013-07-11 DIAGNOSIS — E039 Hypothyroidism, unspecified: Secondary | ICD-10-CM

## 2013-07-11 DIAGNOSIS — E119 Type 2 diabetes mellitus without complications: Secondary | ICD-10-CM

## 2013-07-11 DIAGNOSIS — R634 Abnormal weight loss: Secondary | ICD-10-CM

## 2013-07-11 DIAGNOSIS — I2510 Atherosclerotic heart disease of native coronary artery without angina pectoris: Secondary | ICD-10-CM

## 2013-07-11 LAB — BASIC METABOLIC PANEL
BUN: 18 mg/dL (ref 6–23)
CO2: 32 mEq/L (ref 19–32)
Calcium: 9.5 mg/dL (ref 8.4–10.5)
Chloride: 100 mEq/L (ref 96–112)
Creatinine, Ser: 1.1 mg/dL (ref 0.4–1.2)
GFR: 47.98 mL/min — ABNORMAL LOW (ref >60.00)
GLUCOSE: 86 mg/dL (ref 70–99)
POTASSIUM: 3.8 meq/L (ref 3.5–5.1)
Sodium: 139 mEq/L (ref 135–145)

## 2013-07-11 LAB — SEDIMENTATION RATE: Sed Rate: 63 mm/hr — ABNORMAL HIGH (ref 0–22)

## 2013-07-11 LAB — HEMOGLOBIN A1C: Hgb A1c MFr Bld: 6.8 % — ABNORMAL HIGH (ref 4.6–6.5)

## 2013-07-11 LAB — LIPID PANEL
CHOL/HDL RATIO: 6
Cholesterol: 290 mg/dL — ABNORMAL HIGH (ref 0–200)
HDL: 47.9 mg/dL (ref >39.00)
LDL Cholesterol: 199 mg/dL — ABNORMAL HIGH (ref 0–99)
Triglycerides: 216 mg/dL — ABNORMAL HIGH (ref 0.0–149.0)
VLDL: 43.2 mg/dL — AB (ref 0.0–40.0)

## 2013-07-11 LAB — CK: Total CK: 61 U/L (ref 7–177)

## 2013-07-11 NOTE — Progress Notes (Signed)
Pre visit review using our clinic review tool, if applicable. No additional management support is needed unless otherwise documented below in the visit note. 

## 2013-07-11 NOTE — Assessment & Plan Note (Signed)
Continue with current prescription therapy as reflected on the Med list.  

## 2013-07-11 NOTE — Assessment & Plan Note (Signed)
Resolved

## 2013-07-11 NOTE — Progress Notes (Signed)
   Subjective:     HPI  F/u pains all over (?RA) and stiffness x 6+ mo, much better with d/c'ing Crestor, better w/adding steroids and with increasing her steroid dose  F/u  wt loss and depression - better. F/u edema - better   The patient presents for a follow-up of  chronic hypertension, A. Fib, chronic dyslipidemia, type 2 diabetes controlled with medicines. F/u memory issues  BP Readings from Last 3 Encounters:  07/11/13 120/82  04/25/13 120/82  04/21/13 130/82   Wt Readings from Last 3 Encounters:  07/11/13 137 lb (62.143 kg)  04/25/13 136 lb (61.689 kg)  04/21/13 139 lb (63.05 kg)       Review of Systems  Constitutional: Positive for chills, activity change, appetite change, fatigue and unexpected weight change.  HENT: Negative for congestion, mouth sores, sinus pressure, trouble swallowing and voice change.   Eyes: Negative for visual disturbance.  Respiratory: Positive for shortness of breath. Negative for cough, chest tightness and wheezing.   Cardiovascular: Positive for leg swelling.  Gastrointestinal: Negative for nausea, abdominal pain and diarrhea.  Genitourinary: Negative for frequency, difficulty urinating and vaginal pain.  Musculoskeletal: Negative for back pain and gait problem.  Skin: Negative for pallor and rash.  Neurological: Positive for dizziness, weakness and light-headedness. Negative for tremors, numbness and headaches.  Psychiatric/Behavioral: Positive for confusion. Negative for suicidal ideas and sleep disturbance. The patient is not nervous/anxious.        Objective:   Physical Exam  Constitutional: She appears well-developed. No distress.  Chronically ill appearing  HENT:  Head: Normocephalic.  Right Ear: External ear normal.  Left Ear: External ear normal.  Nose: Nose normal.  Mouth/Throat: Oropharynx is clear and moist.  Eyes: Conjunctivae are normal. Pupils are equal, round, and reactive to light. Right eye exhibits no discharge.  Left eye exhibits no discharge.  Neck: Normal range of motion. Neck supple. No JVD present. No tracheal deviation present. No thyromegaly present.  Cardiovascular: Normal rate.   Murmur heard. irreg irreg  Pulmonary/Chest: No stridor. No respiratory distress. She has no wheezes.  Abdominal: Soft. Bowel sounds are normal. She exhibits no distension and no mass. There is no tenderness. There is no rebound and no guarding.  Genitourinary: Guaiac negative stool.  Musculoskeletal: She exhibits no edema and no tenderness.  Lymphadenopathy:    She has no cervical adenopathy.  Neurological: She displays normal reflexes. No cranial nerve deficit. She exhibits normal muscle tone. Coordination normal.  Skin: No rash noted. No erythema.  Psychiatric: She has a normal mood and affect. Her behavior is normal. Judgment and thought content normal.  Less ataxic, less weak Ankles w/trce edema   Lab Results  Component Value Date   WBC 15.1* 03/25/2013   HGB 14.7 03/25/2013   HCT 43.1 03/25/2013   PLT 360.0 03/25/2013   GLUCOSE 159* 04/18/2013   CHOL 170 09/16/2011   TRIG 156.0* 09/16/2011   HDL 63.20 09/16/2011   LDLDIRECT 100.5 09/18/2009   LDLCALC 76 09/16/2011   ALT 26 03/25/2013   AST 43* 03/25/2013   NA 132* 04/18/2013   K 5.0 04/18/2013   CL 98 04/18/2013   CREATININE 1.0 04/18/2013   BUN 18 04/18/2013   CO2 26 04/18/2013   TSH 2.83 03/25/2013   INR 3.0 06/29/2013   HGBA1C 6.6* 09/28/2012        Assessment & Plan:

## 2013-07-19 ENCOUNTER — Other Ambulatory Visit: Payer: Self-pay | Admitting: *Deleted

## 2013-07-25 NOTE — Telephone Encounter (Signed)
Error

## 2013-07-26 ENCOUNTER — Telehealth: Payer: Self-pay | Admitting: Internal Medicine

## 2013-07-26 NOTE — Telephone Encounter (Signed)
#   busy, will try again tomorrow.

## 2013-07-26 NOTE — Telephone Encounter (Signed)
Cont Prednisone Thx

## 2013-07-26 NOTE — Telephone Encounter (Signed)
Message copied by Tresa Garter on Tue Jul 26, 2013  1:19 PM ------      Message from: Marlene Lard      Created: Tue Jul 19, 2013  2:09 PM       Patient notified of results. Pt stated that she has been taking prednisone. Pt wanted to know if you want her to continue taking prednisone after she has completed refills? ------

## 2013-07-27 ENCOUNTER — Ambulatory Visit (INDEPENDENT_AMBULATORY_CARE_PROVIDER_SITE_OTHER): Payer: Medicare Other | Admitting: *Deleted

## 2013-07-27 DIAGNOSIS — Z7901 Long term (current) use of anticoagulants: Secondary | ICD-10-CM

## 2013-07-27 DIAGNOSIS — I4891 Unspecified atrial fibrillation: Secondary | ICD-10-CM

## 2013-07-27 LAB — POCT INR: INR: 2

## 2013-07-27 NOTE — Telephone Encounter (Signed)
Patient notified

## 2013-08-02 ENCOUNTER — Other Ambulatory Visit: Payer: Self-pay | Admitting: *Deleted

## 2013-08-02 MED ORDER — POTASSIUM CHLORIDE CRYS ER 20 MEQ PO TBCR: 20.0000 meq | EXTENDED_RELEASE_TABLET | Freq: Every day | ORAL | Status: AC

## 2013-08-03 ENCOUNTER — Other Ambulatory Visit (INDEPENDENT_AMBULATORY_CARE_PROVIDER_SITE_OTHER): Payer: Medicare Other

## 2013-08-03 ENCOUNTER — Encounter: Payer: Self-pay | Admitting: Internal Medicine

## 2013-08-03 ENCOUNTER — Ambulatory Visit (INDEPENDENT_AMBULATORY_CARE_PROVIDER_SITE_OTHER): Payer: Medicare Other | Admitting: Internal Medicine

## 2013-08-03 VITALS — BP 98/60 | HR 92 | Temp 97.2°F | Resp 15 | Wt 136.8 lb

## 2013-08-03 DIAGNOSIS — R69 Illness, unspecified: Secondary | ICD-10-CM

## 2013-08-03 DIAGNOSIS — R55 Syncope and collapse: Secondary | ICD-10-CM

## 2013-08-03 DIAGNOSIS — R609 Edema, unspecified: Secondary | ICD-10-CM

## 2013-08-03 DIAGNOSIS — I2510 Atherosclerotic heart disease of native coronary artery without angina pectoris: Secondary | ICD-10-CM

## 2013-08-03 DIAGNOSIS — Z7409 Other reduced mobility: Secondary | ICD-10-CM

## 2013-08-03 LAB — HEPATIC FUNCTION PANEL
ALBUMIN: 2.9 g/dL — AB (ref 3.5–5.2)
ALT: 66 U/L — ABNORMAL HIGH (ref 0–35)
AST: 79 U/L — AB (ref 0–37)
Alkaline Phosphatase: 81 U/L (ref 39–117)
Bilirubin, Direct: 0.2 mg/dL (ref 0.0–0.3)
TOTAL PROTEIN: 6.4 g/dL (ref 6.0–8.3)
Total Bilirubin: 0.9 mg/dL (ref 0.3–1.2)

## 2013-08-03 LAB — BASIC METABOLIC PANEL
BUN: 18 mg/dL (ref 6–23)
CHLORIDE: 94 meq/L — AB (ref 96–112)
CO2: 28 meq/L (ref 19–32)
Calcium: 8.9 mg/dL (ref 8.4–10.5)
Creatinine, Ser: 0.9 mg/dL (ref 0.4–1.2)
GFR: 61.59 mL/min (ref >60.00)
GLUCOSE: 133 mg/dL — AB (ref 70–99)
Potassium: 4.1 mEq/L (ref 3.5–5.1)
SODIUM: 132 meq/L — AB (ref 135–145)

## 2013-08-03 LAB — CK: Total CK: 40 U/L (ref 7–177)

## 2013-08-03 NOTE — Patient Instructions (Addendum)
Your next office appointment will be determined based upon review of your pending labs . Those instructions will be transmitted to you  by mail Followup as needed for your acute issue. Please report any significant change in your symptoms.   

## 2013-08-03 NOTE — Progress Notes (Signed)
   Subjective:    Patient ID: [name], female    DOB: 17-Oct-1920, 80 y.o.   MRN: [id number]  HPI Yesterday 08/02/13 her caretaker [name], found her on the bathroom floor approximately 9:00am.  She was lying on her back with her briefs pulled up and there was a stool in the toilet; she was conscious at the time [name] found her.  She states she was lying on the bathroom floor from dawn until 9:00am.  She covered herself with a towel and waited for someone to help her. She cannot recall any events prior to the fall.  She denies pain or injury from the incident.  Since Saturday, she has had increased swelling in both feet and increased fatigue.  She is in a wheelchair today, however she was able to walk up her home stairs yesterday after her fall.  Most recent INR was 2.0 on 07/27/13. BMP, sed rate and A1C from 07/11/13 were reviewed.   Review of Systems She specifically denies chest pain, palpitations, claudication, dyspnea, paroxysmal dyspnea, abdominal swelling or RUQ pain.   Specifically prior to the event she denies headache, limb weakness, limb numbness or tingling.    Objective:   Physical Exam General appearance:adequate nourishment w/o distress. She appears much younger than stated age.  Eyes: No conjunctival inflammation or scleral icterus is present.  Ears: no hemotympanum Neck : torticollis. No masses or tenderness. Thyroid normal Oral exam: Dental hygiene is good; lips and gums are healthy appearing.There is no oropharyngeal erythema or exudate noted.  Heart: Irregular rate & rhythm  & flow murmur Lungs:Chest clear to auscultation; no wheezes, rhonchi,rales ,or rubs present.No increased work of breathing.  Abdomen: bowel sounds normal, soft and non-tender without masses, organomegaly or hernias noted. No guarding or rebound.  Musculoskeletal: [name] intact, no increased tone . Equal extremity  strength.  Skin:Warm & dry. Intact without suspicious lesions or rashes ; no  jaundice or tenting.  Lymphatic: No lymphadenopathy is noted about the head, neck, axilla areas. 3+edema bilaterally R >L foot to ankle; decreased pedal pulses bilaterally. Homans negative  Affect : Alert and interactive. She jokes as she describes this event which occurred on her birthday.     Assessment & Plan:  #1 ? Syncope  ? From postural hypotension with prolonged period on floor. R/O rhabdomyolysis -CK #2 LLE edema, right >L with  therapeutic PT/INR. Probably due to low albumin; rule out renal compromise related to REM no

## 2013-08-03 NOTE — Progress Notes (Signed)
Pre visit review using our clinic review tool, if applicable. No additional management support is needed unless otherwise documented below in the visit note. 

## 2013-08-03 NOTE — Progress Notes (Signed)
   Subjective:    Patient ID: [name], female    DOB: May 30, 1920, 80 y.o.   MRN: [id number]  HPI  Yesterday 08/02/13 her caretaker Narda Amber, found her on the bathroom floor approximately 9:00am.  She was lying on her back with her briefs pulled up; she was conscious at the time Narda Amber found her.  She states she was lying on the bathroom floor from dawn until that time.  She covered herself with a towel and waited for someone to help her. She cannot recall any events prior to the fall.  She denies pain or injury from the incident.   Since Saturday, she has had increased swelling in both feet and increased fatigue.   She is in a wheelchair today, however she was able to walk up her home stairs yesterday after her fall.   Most recent INR was 2.0 on 07/27/13. BMP, sed rate and A1C from 07/11/13 were reviewed.   Review of Systems She specifically denies chest pain, palpitations, claudication, dyspnea, paroxysmal dyspnea, abdominal swelling or RUQ pain.     Objective:   Physical Exam General appearance is one of good health and nourishment w/o distress. She appears much younger than stated age.   Eyes: No conjunctival inflammation or scleral icterus is present.  Oral exam: Dental hygiene is good; lips and gums are healthy appearing.There is no oropharyngeal erythema or exudate noted.   Heart:  Irregular murmur, rate (?)  Lungs:Chest clear to auscultation; no wheezes, rhonchi,rales ,or rubs present.No increased work of breathing.   Abdomen: bowel sounds normal, soft and non-tender without masses, organomegaly or hernias noted.  No guarding or rebound.  Musculoskeletal: ROM intact, equal tone & strength.   Skin:Warm & dry.  Intact without suspicious lesions or rashes ; no jaundice or tenting.   Lymphatic: No lymphadenopathy is noted about the head, neck, axilla areas.  3+edema bilaterally R >L foot to ankle; decreased pedal pulses bilaterally.      Assessment & Plan:  #1 LLE  edema, right - BNP, D-Dimer  #2 s/p fall - ?

## 2013-08-04 ENCOUNTER — Telehealth: Payer: Self-pay | Admitting: Internal Medicine

## 2013-08-04 MED ORDER — CEFUROXIME AXETIL 250 MG PO TABS: 250.0000 mg | ORAL_TABLET | Freq: Two times a day (BID) | ORAL | Status: DC

## 2013-08-04 NOTE — Telephone Encounter (Signed)
Labs are ok overall. Liver tests are up a little Could she have a urinary infection? If urine is too strong - take an antibiotic - ceftin called in. Keep ROV If worse - go to ER Thx

## 2013-08-04 NOTE — Telephone Encounter (Signed)
Pt saw Dr. Alwyn Ren yesterday for swelling in legs.  Her mental state is not right.  She has fallen 3 times and can't remember how she fell.  She was on the bathroom floor Tues when her helper got there.  Last night she fell out of bed twice.  They had to call EMS to help get her back up.   She had labs yesterday.  What are the results?  What to do ?

## 2013-08-04 NOTE — Telephone Encounter (Signed)
Pt's brother Annette Stable informed of below.

## 2013-08-05 ENCOUNTER — Telehealth: Payer: Self-pay | Admitting: Internal Medicine

## 2013-08-05 MED ORDER — CEFUROXIME AXETIL 250 MG PO TABS: 250.0000 mg | ORAL_TABLET | Freq: Two times a day (BID) | ORAL | Status: DC

## 2013-08-05 NOTE — Telephone Encounter (Signed)
I resent Ceftin to [employer]. Pt informed

## 2013-08-05 NOTE — Telephone Encounter (Signed)
Patient states that as of after 5pm yesterday, [facility] has not received her rx for cefUROXime (CEFTIN) 250 MG. She asks that we re-send.

## 2013-08-08 ENCOUNTER — Ambulatory Visit (INDEPENDENT_AMBULATORY_CARE_PROVIDER_SITE_OTHER): Payer: Medicare Other | Admitting: Internal Medicine

## 2013-08-08 ENCOUNTER — Encounter: Payer: Self-pay | Admitting: Internal Medicine

## 2013-08-08 VITALS — BP 102/70 | HR 109 | Temp 97.6°F | Resp 14 | Wt 136.0 lb

## 2013-08-08 DIAGNOSIS — E8809 Other disorders of plasma-protein metabolism, not elsewhere classified: Secondary | ICD-10-CM

## 2013-08-08 DIAGNOSIS — R5383 Other fatigue: Secondary | ICD-10-CM

## 2013-08-08 DIAGNOSIS — I2510 Atherosclerotic heart disease of native coronary artery without angina pectoris: Secondary | ICD-10-CM

## 2013-08-08 DIAGNOSIS — R5381 Other malaise: Secondary | ICD-10-CM

## 2013-08-08 DIAGNOSIS — R609 Edema, unspecified: Secondary | ICD-10-CM

## 2013-08-08 DIAGNOSIS — R531 Weakness: Secondary | ICD-10-CM

## 2013-08-08 NOTE — Patient Instructions (Signed)
Wear over the calf support hose during the day after edema has gone down over night.  Total protein & albumin reflect nutrition; low fat  or plant protein (Ex soy) sources from [facility]  are recommended as supplements.Dose should not exceed per package label. Low albumin can also be  associated with significant swelling in the legs

## 2013-08-08 NOTE — Progress Notes (Signed)
   Subjective:    Patient ID: [name], female    DOB: 1920/10/19, 80 y.o.   MRN: [id number]  HPI    She has  diffuse muscle weakness not associated with pain.  She had been seen 08/03/13; that time she was found after being down on the floor for several hours. The concern was rhabdomyolysis with possible kidney impairment  As suggested clinically by her profound pedal edema her albumin was low at 2.9. She also had mild elevation of AST 79  & ALT at 66.  Creatinine was 0.9; BUN 18.  She is beginning to use the Ensure protein supplement. She has not purchased any soy protein from [facility].  She has support hose but does not wear them. The edema will resolve overnight.  Despite her weakness she has not wanted to be seen by physical therapy      Review of Systems  She is not having constitutional symptoms of fever, chills, sweats, or weight loss  As noted there is no muscle pain despite diffuse weakness  She's had no associated rashes or lesions.     Objective:   Physical Exam   She appears weak but adequately nourished. She is in a wheelchair.  She has no lymphadenopathy about the neck and axilla  Chest is clear with no increased work of breathing.  She is in a regular rhythm  Abdomen soft; bowel sounds are normal   3+ edema of the feet with a gelatinous appearance to the skin. Pedal pulses are decreased by the edema.         Assessment & Plan:  #1 generalized weakness with normal CK. Much of this relates to deconditioning. Physical therapy declined  #2 edema related to low albumin. It was recommended she supplement the Ensure with soy protein 15 g twice a day in foods and beverages.  Support hose were recommended in the morning after the pedal edema has improved significantly.

## 2013-08-08 NOTE — Progress Notes (Signed)
Pre visit review using our clinic review tool, if applicable. No additional management support is needed unless otherwise documented below in the visit note. 

## 2013-08-15 ENCOUNTER — Telehealth: Payer: Self-pay | Admitting: *Deleted

## 2013-08-15 NOTE — Telephone Encounter (Signed)
A user error has taken place.

## 2013-08-16 ENCOUNTER — Telehealth: Payer: Self-pay | Admitting: *Deleted

## 2013-08-16 ENCOUNTER — Telehealth: Payer: Self-pay | Admitting: Cardiology

## 2013-08-16 NOTE — Telephone Encounter (Signed)
Patient was admitted today to hospice. Patient has had several falls over the last few weeks. Is it necessary to continue coumadin? Please call and advise.

## 2013-08-16 NOTE — Telephone Encounter (Signed)
Will ask Dr Antoine Poche

## 2013-08-16 NOTE — Telephone Encounter (Signed)
Hospice is requesting a verbal order for a DNR for pt.  Please advise

## 2013-08-16 NOTE — Telephone Encounter (Signed)
Ok DNR Thx

## 2013-08-16 NOTE — Telephone Encounter (Signed)
Per Dr Antoine Poche - OK to discontinue.

## 2013-08-16 NOTE — Telephone Encounter (Signed)
[name] with Hospice aware

## 2013-08-17 NOTE — Telephone Encounter (Signed)
Spoke with Pam advised of MDs verbal order.

## 2013-08-29 ENCOUNTER — Other Ambulatory Visit: Payer: Self-pay

## 2013-08-29 NOTE — Telephone Encounter (Signed)
Carol w/ Hospice called stating that she was out on a home visit and pt c/o arthritic pain. Nurse advised patient to use tramadol (already has Rx), however her niece request Rx for prednisone stating that this has helped in the past. Thanks

## 2013-08-29 NOTE — Telephone Encounter (Signed)
OK Prednisone 10 mg a day pc Thx

## 2013-08-30 MED ORDER — PREDNISONE 10 MG PO TABS: 10.0000 mg | ORAL_TABLET | Freq: Every day | ORAL | Status: AC

## 2013-09-12 ENCOUNTER — Other Ambulatory Visit: Payer: Self-pay | Admitting: Internal Medicine

## 2013-10-10 ENCOUNTER — Ambulatory Visit: Payer: Medicare Other | Admitting: Internal Medicine

## 2013-12-13 DEATH — deceased

## 2014-06-13 ENCOUNTER — Ambulatory Visit: Payer: Self-pay | Admitting: Cardiology

## 2014-06-13 DIAGNOSIS — I4891 Unspecified atrial fibrillation: Secondary | ICD-10-CM

## 2015-02-27 IMAGING — CR DG CHEST 2V
2 series · 2 of 2 positions shown · non-contrast
Comparison: 07/02/2011

CLINICAL DATA: Shortness of breath and weakness.

EXAM:
CHEST  2 VIEW

[view not recorded (1 of 2)]
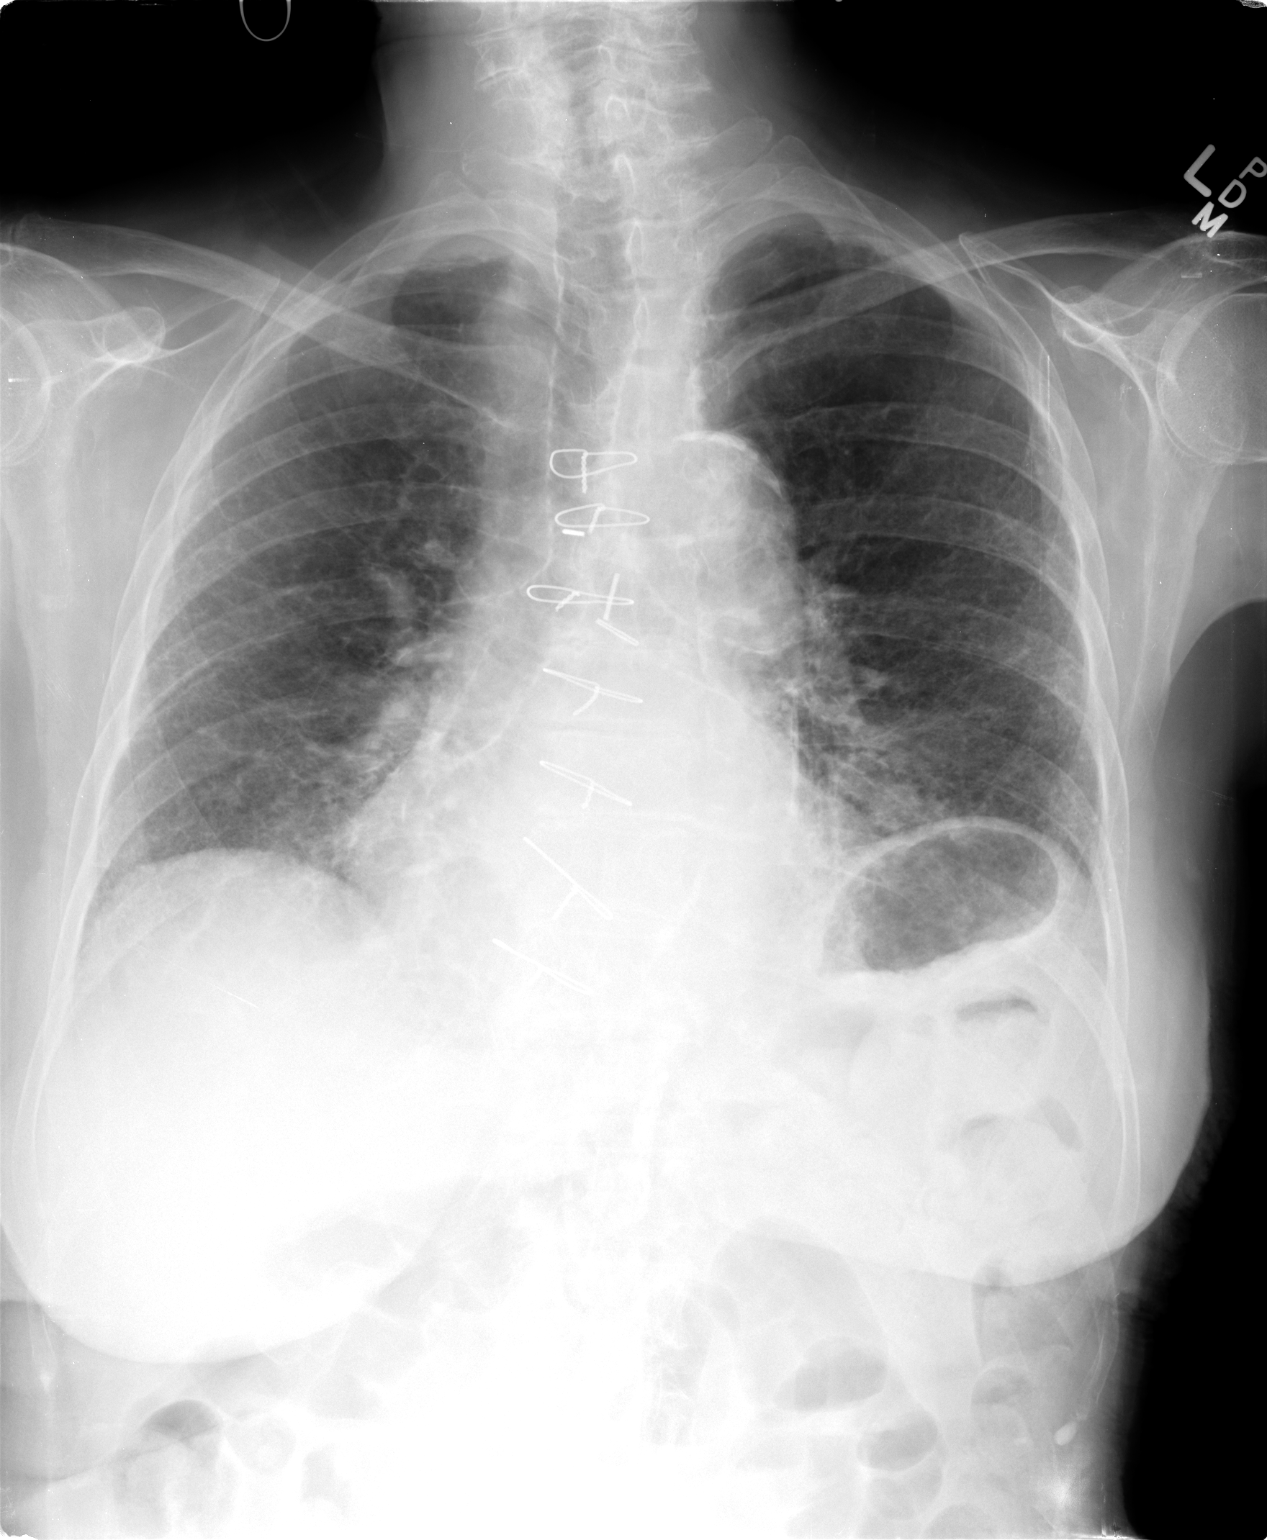

[view not recorded (2 of 2)]
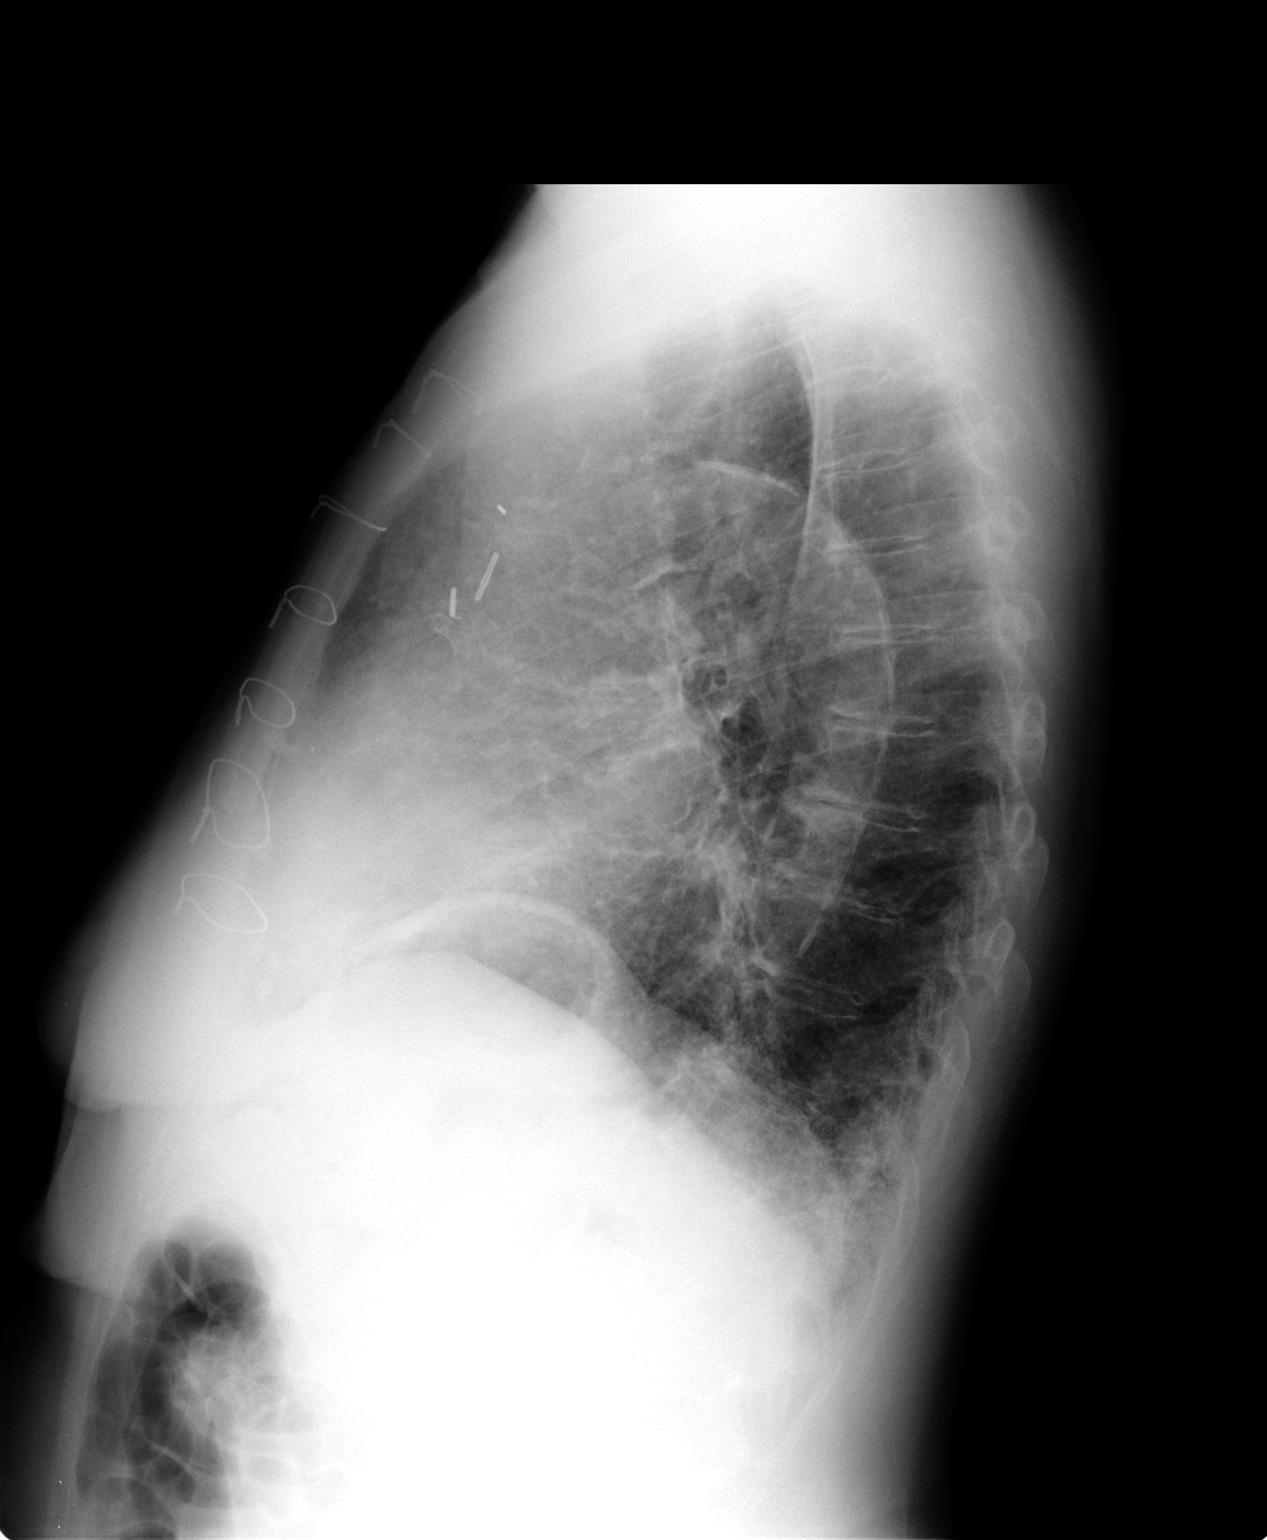

[2 of 2 positions shown; findings below may reference images not displayed]

FINDINGS: Two views of the chest demonstrate median sternotomy wires.
Prominent peripheral interstitial lung markings are chronic. Heart
size is stable. No evidence for edema or airspace disease. The
thoracic aorta is heavily calcified.
IMPRESSION: Chronic lung changes without acute findings.

## 2015-03-14 ENCOUNTER — Encounter: Payer: Self-pay | Admitting: Cardiology
